# Patient Record
Sex: Female | Born: 1935
Health system: Southern US, Community
[De-identification: ages and names within clinical notes are randomized; demographics above are authoritative.]

## PROBLEM LIST (undated history)

## (undated) DIAGNOSIS — T7840XA Allergy, unspecified, initial encounter: Secondary | ICD-10-CM

## (undated) DIAGNOSIS — Z8619 Personal history of other infectious and parasitic diseases: Secondary | ICD-10-CM

## (undated) DIAGNOSIS — C801 Malignant (primary) neoplasm, unspecified: Secondary | ICD-10-CM

## (undated) DIAGNOSIS — I10 Essential (primary) hypertension: Secondary | ICD-10-CM

## (undated) DIAGNOSIS — E7800 Pure hypercholesterolemia, unspecified: Secondary | ICD-10-CM

## (undated) DIAGNOSIS — B019 Varicella without complication: Secondary | ICD-10-CM

## (undated) DIAGNOSIS — K219 Gastro-esophageal reflux disease without esophagitis: Secondary | ICD-10-CM

## (undated) DIAGNOSIS — Z96659 Presence of unspecified artificial knee joint: Secondary | ICD-10-CM

## (undated) DIAGNOSIS — N390 Urinary tract infection, site not specified: Secondary | ICD-10-CM

## (undated) DIAGNOSIS — J45909 Unspecified asthma, uncomplicated: Secondary | ICD-10-CM

## (undated) DIAGNOSIS — M1990 Unspecified osteoarthritis, unspecified site: Secondary | ICD-10-CM

## (undated) DIAGNOSIS — K635 Polyp of colon: Secondary | ICD-10-CM

## (undated) DIAGNOSIS — K8020 Calculus of gallbladder without cholecystitis without obstruction: Secondary | ICD-10-CM

## (undated) DIAGNOSIS — H3530 Unspecified macular degeneration: Secondary | ICD-10-CM

## (undated) DIAGNOSIS — K5792 Diverticulitis of intestine, part unspecified, without perforation or abscess without bleeding: Secondary | ICD-10-CM

## (undated) HISTORY — DX: Urinary tract infection, site not specified: N39.0

## (undated) HISTORY — DX: Personal history of other infectious and parasitic diseases: Z86.19

## (undated) HISTORY — PX: REPLACEMENT TOTAL KNEE: SUR1224

## (undated) HISTORY — DX: Unspecified macular degeneration: H35.30

## (undated) HISTORY — DX: Unspecified osteoarthritis, unspecified site: M19.90

## (undated) HISTORY — DX: Diverticulitis of intestine, part unspecified, without perforation or abscess without bleeding: K57.92

## (undated) HISTORY — DX: Calculus of gallbladder without cholecystitis without obstruction: K80.20

## (undated) HISTORY — DX: Malignant (primary) neoplasm, unspecified: C80.1

## (undated) HISTORY — DX: Allergy, unspecified, initial encounter: T78.40XA

## (undated) HISTORY — DX: Essential (primary) hypertension: I10

## (undated) HISTORY — PX: UPPER GASTROINTESTINAL ENDOSCOPY: SHX188

## (undated) HISTORY — DX: Unspecified asthma, uncomplicated: J45.909

## (undated) HISTORY — DX: Gastro-esophageal reflux disease without esophagitis: K21.9

## (undated) HISTORY — DX: Presence of unspecified artificial knee joint: Z96.659

## (undated) HISTORY — DX: Varicella without complication: B01.9

## (undated) HISTORY — DX: Polyp of colon: K63.5

## (undated) HISTORY — PX: CATARACT EXTRACTION, BILATERAL: SHX1313

## (undated) HISTORY — DX: Pure hypercholesterolemia, unspecified: E78.00

## (undated) HISTORY — PX: BREAST BIOPSY: SHX20

---

## 1971-09-16 HISTORY — PX: ECTOPIC PREGNANCY SURGERY: SHX613

## 1991-09-16 HISTORY — PX: CHOLECYSTECTOMY: SHX55

## 1998-09-15 DIAGNOSIS — Z8619 Personal history of other infectious and parasitic diseases: Secondary | ICD-10-CM

## 1998-09-15 HISTORY — PX: ESOPHAGOGASTRODUODENOSCOPY: SHX1529

## 1998-09-15 HISTORY — DX: Personal history of other infectious and parasitic diseases: Z86.19

## 1999-09-16 HISTORY — PX: APPENDECTOMY: SHX54

## 2012-04-14 DIAGNOSIS — H5213 Myopia, bilateral: Secondary | ICD-10-CM | POA: Insufficient documentation

## 2014-08-29 HISTORY — PX: COLONOSCOPY: SHX174

## 2015-06-18 DIAGNOSIS — M1712 Unilateral primary osteoarthritis, left knee: Secondary | ICD-10-CM | POA: Insufficient documentation

## 2015-06-25 DIAGNOSIS — I10 Essential (primary) hypertension: Secondary | ICD-10-CM | POA: Insufficient documentation

## 2015-06-25 DIAGNOSIS — J4520 Mild intermittent asthma, uncomplicated: Secondary | ICD-10-CM | POA: Insufficient documentation

## 2015-06-25 DIAGNOSIS — J45909 Unspecified asthma, uncomplicated: Secondary | ICD-10-CM | POA: Insufficient documentation

## 2015-06-26 DIAGNOSIS — Z96659 Presence of unspecified artificial knee joint: Secondary | ICD-10-CM

## 2015-06-26 HISTORY — DX: Presence of unspecified artificial knee joint: Z96.659

## 2018-07-15 LAB — BASIC METABOLIC PANEL
BUN: 22 — AB (ref 4–21)
Creatinine: 0.7 (ref 0.5–1.1)
Glucose: 82
Potassium: 4.2 (ref 3.4–5.3)
Sodium: 143 (ref 137–147)

## 2018-07-15 LAB — LIPID PANEL
Cholesterol: 187 (ref 0–200)
HDL: 49 (ref 35–70)
LDL Cholesterol: 125
LDl/HDL Ratio: 3.8
Triglycerides: 64 (ref 40–160)

## 2018-07-15 LAB — CBC AND DIFFERENTIAL
HCT: 37 (ref 36–46)
Hemoglobin: 12.3 (ref 12.0–16.0)
Platelets: 252 (ref 150–399)
WBC: 7.7

## 2018-07-15 LAB — HEPATIC FUNCTION PANEL
ALK PHOS: 62 (ref 25–125)
ALT: 28 (ref 7–35)
AST: 26 (ref 13–35)
Bilirubin, Total: 0.5

## 2018-07-15 LAB — HEMOGLOBIN A1C: Hemoglobin A1C: 5.5

## 2018-10-20 ENCOUNTER — Telehealth: Payer: Self-pay | Admitting: Family Medicine

## 2018-10-20 ENCOUNTER — Ambulatory Visit (INDEPENDENT_AMBULATORY_CARE_PROVIDER_SITE_OTHER): Payer: Medicare Other

## 2018-10-20 ENCOUNTER — Encounter: Payer: Self-pay | Admitting: Gastroenterology

## 2018-10-20 ENCOUNTER — Ambulatory Visit: Payer: Medicare Other | Admitting: Family Medicine

## 2018-10-20 ENCOUNTER — Encounter: Payer: Self-pay | Admitting: Family Medicine

## 2018-10-20 VITALS — BP 126/68 | HR 78 | Temp 97.8°F | Ht 64.5 in | Wt 180.6 lb

## 2018-10-20 DIAGNOSIS — K219 Gastro-esophageal reflux disease without esophagitis: Secondary | ICD-10-CM

## 2018-10-20 DIAGNOSIS — R059 Cough, unspecified: Secondary | ICD-10-CM

## 2018-10-20 DIAGNOSIS — Z1211 Encounter for screening for malignant neoplasm of colon: Secondary | ICD-10-CM | POA: Diagnosis not present

## 2018-10-20 DIAGNOSIS — M2550 Pain in unspecified joint: Secondary | ICD-10-CM

## 2018-10-20 DIAGNOSIS — R05 Cough: Secondary | ICD-10-CM

## 2018-10-20 DIAGNOSIS — K5900 Constipation, unspecified: Secondary | ICD-10-CM

## 2018-10-20 DIAGNOSIS — E7800 Pure hypercholesterolemia, unspecified: Secondary | ICD-10-CM

## 2018-10-20 DIAGNOSIS — Z780 Asymptomatic menopausal state: Secondary | ICD-10-CM

## 2018-10-20 DIAGNOSIS — R739 Hyperglycemia, unspecified: Secondary | ICD-10-CM

## 2018-10-20 MED ORDER — FLUTICASONE-SALMETEROL 100-50 MCG/DOSE IN AEPB: 1.0000 | INHALATION_SPRAY | Freq: Every day | RESPIRATORY_TRACT | 1 refills | Status: DC

## 2018-10-20 MED ORDER — GABAPENTIN 100 MG PO CAPS: 100.0000 mg | ORAL_CAPSULE | Freq: Every day | ORAL | 3 refills | Status: DC

## 2018-10-20 MED ORDER — MELOXICAM 15 MG PO TABS: 15.0000 mg | ORAL_TABLET | Freq: Every day | ORAL | 0 refills | Status: DC

## 2018-10-20 NOTE — Telephone Encounter (Signed)
Stacy from [facility] called with results of chest xray report.    IMPRESSION: Curvilinear density seen only on the lateral projection. Noncontrast CT is recommended to rule out underlying mass lesion although this likely represents an epicardial fat pad.  Flow at [facility] at [facility] notified that the results are in chart and radiologist wanted them to know the results.  Routing to office.

## 2018-10-20 NOTE — Progress Notes (Signed)
Renee Green is a 85 y.o. female is here to Renee Green.   Patient Care Team: Renee Deutscher, DO as PCP - General (Family Medicine)   History of Present Illness:   HPI: Cough x week, worsening, with wheeze. No CP. Hx of severe PNA. Worried that this could happen again. Nonsmoker.   Arthritis. Multijoint. Takes NSAIDs sparingly.   Medication list reviewed.   Health Maintenance Due  Topic Date Due  . TETANUS/TDAP  08/22/1952  . DEXA SCAN  08/22/2000  . PNA vac Low Risk Adult (1 of 2 - PCV13) 08/22/2000   Depression screen PHQ 2/9 10/20/2018  Decreased Interest 0  Down, Depressed, Hopeless 0  PHQ - 2 Score 0  Altered sleeping 0  Tired, decreased energy 1  Change in appetite 1  Feeling bad or failure about yourself  0  Trouble concentrating 0  Moving slowly or fidgety/restless 0  Suicidal thoughts 0  PHQ-9 Score 2    PMHx, SurgHx, SocialHx, Medications, and Allergies were reviewed in the Visit Navigator and updated as appropriate.   Past Medical History:  Diagnosis Date  . Asthma   . Chicken pox   . Diverticulitis   . High cholesterol   . Hypertension      Past Surgical History:  Procedure Laterality Date  . APPENDECTOMY  2001  . BREAST BIOPSY  1987, 1993  . CHOLECYSTECTOMY  1993  . [place]   lost right tube   . REPLACEMENT TOTAL KNEE Bilateral 2016 and 2017     Family History  Problem Relation Age of Onset  . Hypertension Mother   . Breast cancer Mother   . Lung cancer Father   . Prostate cancer Brother   . Hypertension Brother   . Early death Maternal Grandfather   . Early death Brother     Social History   Tobacco Use  . Smoking status: Never Smoker  . Smokeless tobacco: Never Used  Substance Use Topics  . Alcohol use: Never    Frequency: Never  . Drug use: Never    Current Medications and Allergies   .  aspirin EC 81 MG tablet, Take 81 mg by mouth daily., Disp: , Rfl:  .  calcium citrate-vitamin D  (CITRACAL+D) 315-200 MG-UNIT tablet, Take 1 tablet by mouth 2 (two) times daily., Disp: , Rfl:  .  Cholecalciferol (VITAMIN D3) 50 MCG (2000 UT) CHEW, Chew by mouth., Disp: , Rfl:  .  ibuprofen (ADVIL,MOTRIN) 200 MG tablet, Take 200 mg by mouth every 6 (six) hours as needed., Disp: , Rfl:  .  Omega-3 Fatty Acids (FISH OIL) 1200 MG CPDR, Take 1,200 mg by mouth 4 (four) times daily., Disp: , Rfl:  .  omeprazole (PRILOSEC) 20 MG capsule, Take 20 mg by mouth daily., Disp: , Rfl:  .  Polyethyl Glycol-Propyl Glycol 0.4-0.3 % SOLN, Apply to eye., Disp: , Rfl:  .  Red Yeast Rice 600 MG CAPS, Take by mouth., Disp: , Rfl:  .  Fluticasone-Salmeterol (ADVAIR DISKUS) 100-50 MCG/DOSE AEPB, Inhale 1 puff into the lungs daily., Disp: 60 each, Rfl: 1 .  gabapentin (NEURONTIN) 100 MG capsule, Take 1 capsule (100 mg total) by mouth at bedtime. Increase as tolerated up to three times a day, Disp: 90 capsule, Rfl: 3 .  hydrochlorothiazide (HYDRODIURIL) 50 MG tablet, Take 1 tablet (50 mg total) by mouth daily., Disp: 90 tablet, Rfl: 3 .  losartan (COZAAR) 50 MG tablet, Take 1 tablet (50 mg total) by mouth  daily., Disp: 90 tablet, Rfl: 3 .  meloxicam (MOBIC) 15 MG tablet, Take 1 tablet (15 mg total) by mouth daily., Disp: 30 tablet, Rfl: 0  Allergies  Allergen Reactions  . Latex     Blister   . Penicillins Rash   Review of Systems   Pertinent items are noted in the HPI. Otherwise, a complete ROS is negative.  Vitals   Vitals:   10/20/18 1014  BP: 126/68  Pulse: 78  Temp: 97.8 F (36.6 C)  TempSrc: Oral  SpO2: 97%  Weight: 180 lb 9.6 oz (81.9 kg)  Height: 5' 4.5" (1.638 m)     Body mass index is 30.52 kg/m.  Physical Exam   Physical Exam Vitals signs and nursing note reviewed.  Constitutional:      General: She is not in acute distress.    Appearance: Normal appearance.  HENT:     Head: Normocephalic and atraumatic.     Right Ear: Tympanic membrane, ear canal and external ear normal.      Left Ear: Tympanic membrane, ear canal and external ear normal.     Nose: Nose normal.     Mouth/Throat:     Mouth: Mucous membranes are moist.  Eyes:     Extraocular Movements: Extraocular movements intact.     Conjunctiva/sclera: Conjunctivae normal.     Pupils: Pupils are equal, round, and reactive to light.  Neck:     Musculoskeletal: Normal range of motion and neck supple.  Cardiovascular:     Rate and Rhythm: Normal rate and regular rhythm.  Pulmonary:     Effort: Pulmonary effort is normal.     Breath sounds: No wheezing or rhonchi.  Abdominal:     Palpations: Abdomen is soft.  Skin:    General: Skin is warm.     Capillary Refill: Capillary refill takes less than 2 seconds.  Neurological:     General: No focal deficit present.     Mental Status: She is alert and oriented to person, place, and time.  Psychiatric:        Mood and Affect: Mood normal.        Behavior: Behavior normal.        Thought Content: Thought content normal.        Judgment: Judgment normal.     Results for orders placed or performed in visit on 10/20/18  CBC and differential  Result Value Ref Range   Hemoglobin 12.3 12.0 - 16.0   HCT 37 36 - 46   Platelets 252 150 - 399   WBC 7.7   Basic metabolic panel  Result Value Ref Range   Glucose 82    BUN 22 (A) 4 - 21   Creatinine 0.7 0.5 - 1.1   Potassium 4.2 3.4 - 5.3   Sodium 143 137 - 147  Lipid panel  Result Value Ref Range   LDl/HDL Ratio 3.8    Triglycerides 64 40 - 160   Cholesterol 187 0 - 200   HDL 49 35 - 70   LDL Cholesterol 125   Hepatic function panel  Result Value Ref Range   Alkaline Phosphatase 62 25 - 125   ALT 28 7 - 35   AST 26 13 - 35   Bilirubin, Total 0.5   Hemoglobin A1c  Result Value Ref Range   Hemoglobin A1C 5.5     Assessment and Plan   Renee Green was seen today for establish care.  Diagnoses and all orders for this  visit:  Cough -     DG Chest 2 View; Future -     Fluticasone-Salmeterol (ADVAIR DISKUS)  100-50 MCG/DOSE AEPB; Inhale 1 puff into the lungs daily.  Screening for colon cancer -     Ambulatory referral to Gastroenterology  Gastroesophageal reflux disease, esophagitis presence not specified  Postmenopausal  Pure hypercholesterolemia  Arthralgia, unspecified joint -     meloxicam (MOBIC) 15 MG tablet; Take 1 tablet (15 mg total) by mouth daily.  Constipation, unspecified constipation type  Hyperglycemia  Other orders -     gabapentin (NEURONTIN) 100 MG capsule; Take 1 capsule (100 mg total) by mouth at bedtime. Increase as tolerated up to three times a day   . Orders and follow up as documented in [place], reviewed diet, exercise and weight control, cardiovascular risk and specific lipid/LDL goals reviewed, reviewed medications and side effects in detail.  . Reviewed expectations re: course of current medical issues. . Outlined signs and symptoms indicating need for more acute intervention. . Patient verbalized understanding and all questions were answered. . Patient received an After Visit Summary.  Renee Deutscher, DO [place] [place] 10/29/2018  Records requested if needed. Time spent with the patient: 25 minutes, of which >50% was spent in obtaining information about her symptoms, reviewing her previous labs, evaluations, and treatments, counseling her about her condition (please see the discussed topics above), and developing a plan to further investigate it; she had a number of questions which I addressed.

## 2018-10-20 NOTE — Telephone Encounter (Signed)
See note

## 2018-10-20 NOTE — Telephone Encounter (Signed)
MEDICATION: hydrochlorothiazide (HYDRODIURIL) 50 MG tablet  losartan (COZAAR) 50 MG tablet (Pharmacy says they do not have 50 MG tablets, they may call to clarify according to patient.)   PHARMACY:  [id number] - [place], [place] - [address] A 90 DAY SUPPLY : Yes  IS PATIENT OUT OF MEDICATION: No  IF NOT; HOW MUCH IS LEFT: Maybe a week worth left  LAST APPOINTMENT DATE: @2 /01/2019  NEXT APPOINTMENT DATE:@Visit  date not found  OTHER COMMENTS:    **Let patient know to contact pharmacy at the end of the day to make sure medication is ready. **  ** Please notify patient to allow 48-72 hours to process**  **Encourage patient to contact the pharmacy for refills or they can request refills through [facility]**

## 2018-10-21 ENCOUNTER — Ambulatory Visit (HOSPITAL_COMMUNITY)
Admission: RE | Admit: 2018-10-21 | Discharge: 2018-10-21 | Disposition: A | Discharge status: Home / Self Care | Payer: Medicare Other | Source: Ambulatory Visit | Attending: Family Medicine | Admitting: Family Medicine

## 2018-10-21 ENCOUNTER — Other Ambulatory Visit: Payer: Self-pay

## 2018-10-21 DIAGNOSIS — R0602 Shortness of breath: Secondary | ICD-10-CM | POA: Diagnosis not present

## 2018-10-21 MED ORDER — HYDROCHLOROTHIAZIDE 50 MG PO TABS: 50.0000 mg | ORAL_TABLET | Freq: Every day | ORAL | 3 refills | Status: DC

## 2018-10-21 MED ORDER — LOSARTAN POTASSIUM 50 MG PO TABS: 50.0000 mg | ORAL_TABLET | Freq: Every day | ORAL | 3 refills | Status: DC

## 2018-10-21 NOTE — Telephone Encounter (Signed)
Called patient let her know that they will work her in at [facility]. She will go now.

## 2018-10-21 NOTE — Telephone Encounter (Signed)
Please call patient and let her know that her chest xray doesn't show any pneumonia.  It does however show a small area that needs further imaging. It is recommended that we get a CT scan to further investigate. If she is agreeable, please order urgent "noncontrast CT" of chest for patient and notify referral coordinator, Honor Loh of urgent order.  Because she is a new patient as well -- please clarify if she has any history of prior cardiac or lung issues that may not have been discussed during office visit. Also, review smoking status/history.  Further intervention based on CT scan results.

## 2018-10-21 NOTE — Telephone Encounter (Signed)
Please advise 

## 2018-10-21 NOTE — Telephone Encounter (Signed)
Called patient let her know I will call her with app she has no questions she has history of smoking in her 63's and 30's for a few years. No cardiac history in herself or family. Had frequent lung infections about 20ys ago for about a year.

## 2018-10-21 NOTE — Telephone Encounter (Signed)
Called patient she needs refills of medications called in have taken care of that for her. She will call if any further questions.

## 2018-10-22 ENCOUNTER — Other Ambulatory Visit: Payer: Self-pay

## 2018-10-22 ENCOUNTER — Encounter: Payer: Self-pay | Admitting: Family Medicine

## 2018-10-22 MED ORDER — LOSARTAN POTASSIUM 25 MG PO TABS: 50.0000 mg | ORAL_TABLET | Freq: Every day | ORAL | 0 refills | Status: DC

## 2018-11-08 ENCOUNTER — Other Ambulatory Visit: Payer: Self-pay | Admitting: Family Medicine

## 2018-11-08 DIAGNOSIS — M2550 Pain in unspecified joint: Secondary | ICD-10-CM

## 2018-11-09 ENCOUNTER — Other Ambulatory Visit: Payer: Medicare Other

## 2018-11-09 ENCOUNTER — Ambulatory Visit: Payer: Medicare Other | Admitting: Gastroenterology

## 2018-11-09 ENCOUNTER — Encounter: Payer: Self-pay | Admitting: Gastroenterology

## 2018-11-09 VITALS — BP 110/58 | HR 75 | Ht 65.0 in | Wt 182.0 lb

## 2018-11-09 DIAGNOSIS — K529 Noninfective gastroenteritis and colitis, unspecified: Secondary | ICD-10-CM

## 2018-11-09 DIAGNOSIS — R159 Full incontinence of feces: Secondary | ICD-10-CM | POA: Diagnosis not present

## 2018-11-09 DIAGNOSIS — R1032 Left lower quadrant pain: Secondary | ICD-10-CM | POA: Diagnosis not present

## 2018-11-09 MED ORDER — PEG-KCL-NACL-NASULF-NA ASC-C 140 G PO SOLR: 140.0000 g | ORAL | 0 refills | Status: DC

## 2018-11-09 NOTE — Patient Instructions (Signed)
If you are age 83 or older, your body mass index should be between 23-30. Your Body mass index is 30.29 kg/m. If this is out of the aforementioned range listed, please consider follow up with your Primary Care Provider.  If you are age 46 or younger, your body mass index should be between 19-25. Your Body mass index is 30.29 kg/m. If this is out of the aformentioned range listed, please consider follow up with your Primary Care Provider.   You have been scheduled for a colonoscopy. Please follow written instructions given to you at your visit today.  Please pick up your prep supplies at the pharmacy within the next 1-3 days. If you use inhalers (even only as needed), please bring them with you on the day of your procedure. Your physician has requested that you go to www.startemmi.com and enter the access code given to you at your visit today. This web site gives a general overview about your procedure. However, you should still follow specific instructions given to you by our office regarding your preparation for the procedure.  Your provider has requested that you go to the basement level for lab work before leaving today. Press "B" on the elevator. The lab is located at the first door on the left as you exit the elevator.  It was a pleasure to see you today!  Dr. Loletha Carrow

## 2018-11-09 NOTE — Progress Notes (Signed)
[facility] Consult Note:  History: Renee Green 11/09/2018  Referring physician: Briscoe Deutscher, DO  Reason for consult/chief complaint: Diarrhea (Per pt extreme diarrhea once a week ); Abdominal Pain (General abd pain. cramping every day. comes and goes, today its been all day. ); Rectal Bleeding (Blood on surface when she wipes. ); and Rectal Pain (rectal pain with the cramping episodes )   Subjective  HPI:  This is a pleasant 85 year old woman referred by primary care after recent visit there.  She established care with Dr. Juleen China in October after moving here from [facility].  Everli reports that she developed frequent diarrhea late last summer.  She has been under increased stress with her husband's poor health and their move to this area. It is difficult to get a clear inconsistent history but it sounds like she has some lower abdominal cramping with a loose stool every couple of days, then she will have episodes of severe urgency with large volume loose stool that may cause incontinence.  She has some crampy lower abdominal pain, more so before bowel movements.  After wiping she may see blood on the paper. She had a recent primary care appointment for chronic cough, chest x-ray was done and an inhaler prescribed.  She was then referred to [place]  Of note, she was treated for urinary tract infection twice this past July by her previous primary care provider, and seems to recall getting ciprofloxacin and perhaps one other antibiotic.  ROS:  Review of Systems  Constitutional: Negative for appetite change and unexpected weight change.  HENT: Negative for mouth sores and voice change.   Eyes: Negative for pain and redness.  Respiratory: Negative for cough and shortness of breath.   Cardiovascular: Negative for chest pain and palpitations.  Genitourinary: Negative for dysuria and hematuria.  Musculoskeletal: Positive for arthralgias. Negative for  myalgias.  Skin: Negative for pallor and rash.  Allergic/Immunologic: Positive for environmental allergies.  Neurological: Negative for weakness and headaches.  Hematological: Negative for adenopathy.     Past Medical History: Past Medical History:  Diagnosis Date  . Arthritis   . Asthma   . Chicken pox   . Colon polyp   . Diverticulitis   . Gallstones   . GERD (gastroesophageal reflux disease)   . High cholesterol   . Hypertension   . UTI (urinary tract infection)      Past Surgical History: Past Surgical History:  Procedure Laterality Date  . APPENDECTOMY  2001  . BREAST BIOPSY  1987, 1993  . CHOLECYSTECTOMY  1993  . COLONOSCOPY  08/29/2014  . [place]   lost right tube   . ESOPHAGOGASTRODUODENOSCOPY  2000  . REPLACEMENT TOTAL KNEE Bilateral 2016 and 2017   She requires multiple prior endoscopic procedures in both [place] and [facility]. She recalls the last one was sometime in 2015, and that she was told to come back in 5 years because of polyps.  Family History: Family History  Problem Relation Age of Onset  . Hypertension Mother   . Breast cancer Mother   . Colon cancer Mother   . Lung cancer Father   . Prostate cancer Brother   . Hypertension Brother   . Early death Maternal Grandfather   . Early death Brother     Social History: Social History   Socioeconomic History  . Marital status: Married    Spouse name: Not on file  . Number of children: 2  .  Years of education: Not on file  . Highest education level: Not on file  Occupational History  . Occupation: retired  Scientific laboratory technician  . Financial resource strain: Not on file  . Food insecurity:    Worry: Not on file    Inability: Not on file  . Transportation needs:    Medical: Not on file    Non-medical: Not on file  Tobacco Use  . Smoking status: Never Smoker  . Smokeless tobacco: Never Used  Substance and Sexual Activity  . Alcohol use: Never     Frequency: Never  . Drug use: Never  . Sexual activity: Not on file  Lifestyle  . Physical activity:    Days per week: 5 days    Minutes per session: 30 min  . Stress: Not on file  Relationships  . Social connections:    Talks on phone: Not on file    Gets together: Not on file    Attends religious service: Not on file    Active member of club or organization: Not on file    Attends meetings of clubs or organizations: Not on file    Relationship status: Not on file  Other Topics Concern  . Not on file  Social History Narrative  . Not on file    Allergies: Allergies  Allergen Reactions  . Latex     Blister   . Penicillins Rash    Outpatient Meds: Current Outpatient Medications  Medication Sig Dispense Refill  . AMBULATORY NON FORMULARY MEDICATION Medication Name: Osteo Bi-Flex Glucosamine Chondroitin 2 per day    . aspirin EC 81 MG tablet Take 81 mg by mouth daily.    . calcium citrate-vitamin D (CITRACAL+D) 315-200 MG-UNIT tablet Take 1 tablet by mouth 2 (two) times daily.    . Cholecalciferol (VITAMIN D3) 50 MCG (2000 UT) CHEW Chew by mouth.    . Fluticasone-Salmeterol (WIXELA INHUB) 100-50 MCG/DOSE AEPB Inhale 1 puff into the lungs daily.    Marland Kitchen gabapentin (NEURONTIN) 100 MG capsule Take 1 capsule (100 mg total) by mouth at bedtime. Increase as tolerated up to three times a day 90 capsule 3  . hydrochlorothiazide (HYDRODIURIL) 50 MG tablet Take 1 tablet (50 mg total) by mouth daily. 90 tablet 3  . losartan (COZAAR) 25 MG tablet Take 2 tablets (50 mg total) by mouth daily. 60 tablet 0  . meloxicam (MOBIC) 15 MG tablet TAKE 1 TABLET BY MOUTH EVERY DAY 30 tablet 0  . Multiple Vitamins-Minerals (PRESERVISION AREDS 2 PO) Take by mouth 2 (two) times daily.    . Omega-3 Fatty Acids (FISH OIL) 1200 MG CPDR Take 1,200 mg by mouth 4 (four) times daily.    Marland Kitchen omeprazole (PRILOSEC) 20 MG capsule Take 20 mg by mouth daily.    . Red Yeast Rice 600 MG CAPS Take by mouth.    Marland Kitchen  PEG-KCl-NaCl-NaSulf-Na Asc-C (PLENVU) 140 g SOLR Take 140 g by mouth as directed. 1 each 0   No current facility-administered medications for this visit.       ___________________________________________________________________ Objective   Exam:  BP (!) 110/58 (BP Location: Left Arm, Patient Position: Sitting, Cuff Size: Normal)   Pulse 75   Ht 5\' 5"  (1.651 m)   Wt 182 lb (82.6 kg)   BMI 30.29 kg/m    General: Well-appearing, good muscle mass, well hydrated, normal vocal quality  Eyes: sclera anicteric, no redness  ENT: oral mucosa moist without lesions, no cervical or supraclavicular lymphadenopathy  CV: RRR without  murmur, S1/S2, no JVD, no peripheral edema  Resp: clear to auscultation bilaterally, normal RR and effort noted  GI: soft, no tenderness, with active bowel sounds. No guarding or palpable organomegaly noted.  Skin; warm and dry, no rash or jaundice noted  Neuro: awake, alert and oriented x 3. Normal gross motor function and fluent speech  Labs:  CBC Latest Ref Rng & Units 07/15/2018  WBC - 7.7  Hemoglobin 12.0 - 16.0 12.3  Hematocrit 36 - 46 37  Platelets 150 - 399 252   CMP Latest Ref Rng & Units 07/15/2018  BUN 4 - 21 22(A)  Creatinine 0.5 - 1.1 0.7  Sodium 137 - 147 143  Potassium 3.4 - 5.3 4.2  Alkaline Phos 25 - 125 62  AST 13 - 35 26  ALT 7 - 35 28   Hemoglobin A1c 5.19 June 2018  Assessment: Encounter Diagnoses  Name Primary?  . Chronic diarrhea Yes  . Full incontinence of feces   . LLQ abdominal pain     Tamani has had diarrhea for about 6 months some occasional crampy lower abdominal pain.  The diarrhea has been so bad at times it causes incontinence.  There is been some blood on the paper which sounds likely benign anal rectal bleeding just hemorrhoids.  Colitis seems less likely. She received antibiotics last year, raising suspicion for C. Difficile. She believes she has been on PPI for years since H. pylori was discovered, but  she stopped it a few months ago and has gone to taking it just as needed.  She believes she has been on the ARB for at least several years.  Plan: C. difficile PCR Colonoscopy a few weeks from now, to allow time for stool study to be done.  She is agreeable after discussion of procedure and risks.  The benefits and risks of the planned procedure were described in detail with the patient or (when appropriate) their health care proxy.  Risks were outlined as including, but not limited to, bleeding, infection, perforation, adverse medication reaction leading to cardiac or pulmonary decompensation.  The limitation of incomplete mucosal visualization was also discussed.  No guarantees or warranties were given.  As needed Imodium.  Thank you for the courtesy of this consult.  Please call me with any questions or concerns.  Nelida Meuse III  CC: Referring provider noted above

## 2018-11-10 ENCOUNTER — Other Ambulatory Visit: Payer: Medicare Other

## 2018-11-10 DIAGNOSIS — R1032 Left lower quadrant pain: Secondary | ICD-10-CM

## 2018-11-10 DIAGNOSIS — R159 Full incontinence of feces: Secondary | ICD-10-CM

## 2018-11-10 DIAGNOSIS — K529 Noninfective gastroenteritis and colitis, unspecified: Secondary | ICD-10-CM

## 2018-11-11 ENCOUNTER — Other Ambulatory Visit: Payer: Self-pay | Admitting: Family Medicine

## 2018-11-11 LAB — CLOSTRIDIUM DIFFICILE TOXIN B, QUALITATIVE, REAL-TIME PCR: Toxigenic C. Difficile by PCR: NOT DETECTED

## 2018-11-15 ENCOUNTER — Other Ambulatory Visit: Payer: Self-pay | Admitting: Family Medicine

## 2018-11-18 ENCOUNTER — Encounter (HOSPITAL_COMMUNITY): Payer: Self-pay

## 2018-11-18 ENCOUNTER — Emergency Department (HOSPITAL_COMMUNITY)
Admission: EM | Admit: 2018-11-18 | Discharge: 2018-11-18 | Disposition: A | Discharge status: Home / Self Care | Payer: Medicare Other | Attending: Emergency Medicine | Admitting: Emergency Medicine

## 2018-11-18 ENCOUNTER — Other Ambulatory Visit: Payer: Self-pay

## 2018-11-18 DIAGNOSIS — Z96651 Presence of right artificial knee joint: Secondary | ICD-10-CM | POA: Diagnosis not present

## 2018-11-18 DIAGNOSIS — Z96652 Presence of left artificial knee joint: Secondary | ICD-10-CM | POA: Insufficient documentation

## 2018-11-18 DIAGNOSIS — Z91040 Latex allergy status: Secondary | ICD-10-CM | POA: Diagnosis not present

## 2018-11-18 DIAGNOSIS — R55 Syncope and collapse: Secondary | ICD-10-CM | POA: Diagnosis not present

## 2018-11-18 DIAGNOSIS — Z7982 Long term (current) use of aspirin: Secondary | ICD-10-CM | POA: Insufficient documentation

## 2018-11-18 DIAGNOSIS — I10 Essential (primary) hypertension: Secondary | ICD-10-CM | POA: Diagnosis not present

## 2018-11-18 DIAGNOSIS — Z79899 Other long term (current) drug therapy: Secondary | ICD-10-CM | POA: Insufficient documentation

## 2018-11-18 DIAGNOSIS — R110 Nausea: Secondary | ICD-10-CM | POA: Insufficient documentation

## 2018-11-18 DIAGNOSIS — J45909 Unspecified asthma, uncomplicated: Secondary | ICD-10-CM | POA: Insufficient documentation

## 2018-11-18 DIAGNOSIS — R1010 Upper abdominal pain, unspecified: Secondary | ICD-10-CM | POA: Insufficient documentation

## 2018-11-18 LAB — CBC WITH DIFFERENTIAL/PLATELET
Abs Immature Granulocytes: 0.02 10*3/uL (ref 0.00–0.07)
Basophils Absolute: 0.1 10*3/uL (ref 0.0–0.1)
Basophils Relative: 1 %
Eosinophils Absolute: 0.6 10*3/uL — ABNORMAL HIGH (ref 0.0–0.5)
Eosinophils Relative: 7 %
HCT: 30.2 % — ABNORMAL LOW (ref 36.0–46.0)
Hemoglobin: 9.6 g/dL — ABNORMAL LOW (ref 12.0–15.0)
Immature Granulocytes: 0 %
Lymphocytes Relative: 17 %
Lymphs Abs: 1.3 10*3/uL (ref 0.7–4.0)
MCH: 30.5 pg (ref 26.0–34.0)
MCHC: 31.8 g/dL (ref 30.0–36.0)
MCV: 95.9 fL (ref 80.0–100.0)
Monocytes Absolute: 0.5 10*3/uL (ref 0.1–1.0)
Monocytes Relative: 7 %
Neutro Abs: 5.3 10*3/uL (ref 1.7–7.7)
Neutrophils Relative %: 68 %
Platelets: 194 10*3/uL (ref 150–400)
RBC: 3.15 MIL/uL — ABNORMAL LOW (ref 3.87–5.11)
RDW: 12.6 % (ref 11.5–15.5)
WBC: 7.8 10*3/uL (ref 4.0–10.5)
nRBC: 0 % (ref 0.0–0.2)

## 2018-11-18 LAB — BASIC METABOLIC PANEL
Anion gap: 8 (ref 5–15)
BUN: 26 mg/dL — ABNORMAL HIGH (ref 8–23)
CO2: 23 mmol/L (ref 22–32)
Calcium: 8.1 mg/dL — ABNORMAL LOW (ref 8.9–10.3)
Chloride: 109 mmol/L (ref 98–111)
Creatinine, Ser: 0.73 mg/dL (ref 0.44–1.00)
GFR calc Af Amer: >60 mL/min (ref >60)
GFR calc non Af Amer: >60 mL/min (ref >60)
Glucose, Bld: 97 mg/dL (ref 70–99)
Potassium: 3 mmol/L — ABNORMAL LOW (ref 3.5–5.1)
SODIUM: 140 mmol/L (ref 135–145)

## 2018-11-18 LAB — I-STAT TROPONIN, ED: TROPONIN I, POC: 0.01 ng/mL (ref 0.00–0.08)

## 2018-11-18 LAB — CBG MONITORING, ED: Glucose-Capillary: 100 mg/dL — ABNORMAL HIGH (ref 70–99)

## 2018-11-18 LAB — POC OCCULT BLOOD, ED: Fecal Occult Bld: NEGATIVE

## 2018-11-18 MED ORDER — POTASSIUM CHLORIDE CRYS ER 20 MEQ PO TBCR: 40.0000 meq | EXTENDED_RELEASE_TABLET | Freq: Once | ORAL | Status: DC

## 2018-11-18 MED ORDER — SODIUM CHLORIDE 0.9 % IV BOLUS
1000.0000 mL | Freq: Once | INTRAVENOUS | Status: AC
  Administered 2018-11-18: 1000 mL via INTRAVENOUS

## 2018-11-18 NOTE — Discharge Instructions (Addendum)
You were here in the emergency department for evaluation after a syncopal event.  You had an EKG and lab work that did not show an serious cause of your episode.  You were more anemia that prior and this will need to be followed by your doctors. You will need to follow-up with your primary care doctor.  Please still well-hydrated and eat regular meals.  Return if any concerns.

## 2018-11-18 NOTE — ED Triage Notes (Signed)
Pt arrives to [facility] ED via [facility] after patient fainted at salon. States she was in salon chair getting hair done, suddenly felt upper abd pain and naus and face became extremely pale. Went to restroom and fainted, woke up on floor with pants down and salon staff at her side. States she did go to [employer] program this morning, also did not eat today as she does intermittent fasting at times. States she has fainted in the past when she has been dehydrated.  SBP 120/60 (sitting) to 113/67 (standing) on EMS arrival CBG 104 12 lead unremarkable SR 60s 18 rr 99% RA  Pt well appearing in ER at this time, denies any s/s of CP, SOB, dizziness, chills.

## 2018-11-18 NOTE — ED Provider Notes (Signed)
[facility] EMERGENCY DEPARTMENT Provider Note   CSN: [id number] Arrival date & time: 11/18/18  1414    History   Chief Complaint Chief Complaint  Patient presents with  . Loss of Consciousness    HPI Renee Green is a 85 y.o. female.  She is brought in by EMS after a syncopal event at the hair salon.  She said she had not eaten much today and [facility]  She was at the her salon when she acutely felt some upper abdominal pain and nausea and went to the bathroom.  She said she was sitting on the toilet when she fainted and woke up on the floor being assisted by salon staff.  No reported seizure activity.  All of her pain is resolved and she feels back to baseline.     The history is provided by the patient.  Loss of Consciousness  Episode history:  Single Most recent episode:  Today Progression:  Resolved Chronicity:  New Context: bowel movement and urination   Witnessed: no   Relieved by:  Certain positions Worsened by:  Nothing Ineffective treatments:  None tried Associated symptoms: nausea   Associated symptoms: no chest pain, no fever, no recent fall, no rectal bleeding, no seizures, no shortness of breath and no vomiting     Past Medical History:  Diagnosis Date  . Arthritis   . Asthma   . Chicken pox   . Colon polyp   . Diverticulitis   . Gallstones   . GERD (gastroesophageal reflux disease)   . High cholesterol   . Hypertension   . UTI (urinary tract infection)     Patient Active Problem List   Diagnosis Date Noted  . Myopia of both eyes 04/14/2012    Past Surgical History:  Procedure Laterality Date  . APPENDECTOMY  2001  . BREAST BIOPSY  1987, 1993  . CHOLECYSTECTOMY  1993  . COLONOSCOPY  08/29/2014  . [place]   lost right tube   . ESOPHAGOGASTRODUODENOSCOPY  2000  . REPLACEMENT TOTAL KNEE Bilateral 2016 and 2017     OB History   No obstetric history on file.       Home Medications    Prior to Admission medications   Medication Sig Start Date End Date Taking? Authorizing Provider  AMBULATORY NON FORMULARY MEDICATION Medication Name: Osteo Bi-Flex Glucosamine Chondroitin 2 per day    [provider]  aspirin EC 81 MG tablet Take 81 mg by mouth daily.    [provider]  calcium citrate-vitamin D (CITRACAL+D) 315-200 MG-UNIT tablet Take 1 tablet by mouth 2 (two) times daily.    [provider]  Cholecalciferol (VITAMIN D3) 50 MCG (2000 UT) CHEW Chew by mouth.    [provider]  Fluticasone-Salmeterol (WIXELA INHUB) 100-50 MCG/DOSE AEPB Inhale 1 puff into the lungs daily.    [provider]  gabapentin (NEURONTIN) 100 MG capsule TAKE 1 CAPSULE (100 MG TOTAL) BY MOUTH AT BEDTIME. INCREASE AS TOLERATED UP TO THREE TIMES A DAY 11/11/18   Briscoe Deutscher, DO  hydrochlorothiazide (HYDRODIURIL) 50 MG tablet Take 1 tablet (50 mg total) by mouth daily. 10/21/18   Briscoe Deutscher, DO  losartan (COZAAR) 25 MG tablet TAKE 2 TABLETS BY MOUTH EVERY DAY 11/15/18   Briscoe Deutscher, DO  meloxicam (MOBIC) 15 MG tablet TAKE 1 TABLET BY MOUTH EVERY DAY 11/08/18   Briscoe Deutscher, DO  Multiple Vitamins-Minerals (PRESERVISION AREDS  2 PO) Take by mouth 2 (two) times daily.    [provider]  Omega-3 Fatty Acids (FISH OIL) 1200 MG CPDR Take 1,200 mg by mouth 4 (four) times daily.    [provider]  omeprazole (PRILOSEC) 20 MG capsule Take 20 mg by mouth daily.    [provider]  PEG-KCl-NaCl-NaSulf-Na Asc-C (PLENVU) 140 g SOLR Take 140 g by mouth as directed. 11/09/18   Doran Stabler, MD  Red Yeast Rice 600 MG CAPS Take by mouth.    [provider]    Family History Family History  Problem Relation Age of Onset  . Hypertension Mother   . Breast cancer Mother   . Colon cancer Mother   . Lung cancer Father   . Prostate cancer Brother   . Hypertension Brother   . Early death Maternal  Grandfather   . Early death Brother     Social History Social History   Tobacco Use  . Smoking status: Never Smoker  . Smokeless tobacco: Never Used  Substance Use Topics  . Alcohol use: Never    Frequency: Never  . Drug use: Never     Allergies   Latex and Penicillins   Review of Systems Review of Systems  Constitutional: Negative for fever.  HENT: Negative for sore throat.   Eyes: Negative for visual disturbance.  Respiratory: Negative for shortness of breath.   Cardiovascular: Positive for syncope. Negative for chest pain.  Gastrointestinal: Positive for nausea. Negative for abdominal pain and vomiting.  Genitourinary: Negative for dysuria.  Musculoskeletal: Negative for neck pain.  Skin: Negative for rash.  Neurological: Positive for syncope. Negative for seizures.     Physical Exam Updated Vital Signs BP (!) 149/71 (BP Location: Right Arm)   Pulse 69   Temp 97.6 F (36.4 C) (Oral)   Resp 18   Ht 5\' 5"  (1.651 m)   Wt 82.5 kg   SpO2 100%   BMI 30.27 kg/m   Physical Exam Vitals signs and nursing note reviewed.  Constitutional:      General: She is not in acute distress.    Appearance: She is well-developed.  HENT:     Head: Normocephalic and atraumatic.  Eyes:     Conjunctiva/sclera: Conjunctivae normal.  Neck:     Musculoskeletal: Neck supple.  Cardiovascular:     Rate and Rhythm: Normal rate and regular rhythm.     Heart sounds: No murmur.  Pulmonary:     Effort: Pulmonary effort is normal. No respiratory distress.     Breath sounds: Normal breath sounds.  Abdominal:     Palpations: Abdomen is soft.     Tenderness: There is no abdominal tenderness.  Musculoskeletal: Normal range of motion.        General: No tenderness or signs of injury.     Right lower leg: No edema.     Left lower leg: No edema.  Skin:    General: Skin is warm and dry.     Capillary Refill: Capillary refill takes less than 2 seconds.  Neurological:     General: No  focal deficit present.     Mental Status: She is alert and oriented to person, place, and time.     Sensory: No sensory deficit.     Motor: No weakness.      ED Treatments / Results  Labs (all labs ordered are listed, but only abnormal results are displayed) Labs Reviewed  BASIC METABOLIC PANEL - Abnormal; Notable for  the following components:      Result Value   Potassium 3.0 (*)    BUN 26 (*)    Calcium 8.1 (*)    All other components within normal limits  CBC WITH DIFFERENTIAL/PLATELET - Abnormal; Notable for the following components:   RBC 3.15 (*)    Hemoglobin 9.6 (*)    HCT 30.2 (*)    Eosinophils Absolute 0.6 (*)    All other components within normal limits  CBG MONITORING, ED - Abnormal; Notable for the following components:   Glucose-Capillary 100 (*)    All other components within normal limits  URINALYSIS, ROUTINE W REFLEX MICROSCOPIC  I-STAT TROPONIN, ED  POC OCCULT BLOOD, ED    EKG EKG Interpretation  Date/Time:  Thursday November 18 2018 14:39:00 EST Ventricular Rate:  71 PR Interval:    QRS Duration: 108 QT Interval:  400 QTC Calculation: 435 R Axis:   47 Text Interpretation:  Sinus rhythm Ventricular premature complex RSR' in V1 or V2, right VCD or RVH no prior to compare with Confirmed by Aletta Edouard [phone]) on 11/18/2018 2:41:16 PM   Radiology No results found.  Procedures Procedures (including critical care time)  Medications Ordered in ED Medications  potassium chloride SA (K-DUR,KLOR-CON) CR tablet 40 mEq (has no administration in time range)  sodium chloride 0.9 % bolus 1,000 mL (0 mLs Intravenous Stopped 11/18/18 1636)     Initial Impression / Assessment and Plan / ED Course  I have reviewed the triage vital signs and the nursing notes.  Pertinent labs & imaging results that were available during my care of the patient were reviewed by me and considered in my medical decision making (see chart for details).  Clinical Course as of Nov 18 1731  Thu Nov 18, 2018  1515 Patient with an unwitnessed syncopal event while on the toilet after experiencing some upper abdominal pain.  She feels back to baseline here now.   [MB]  1696   Her lab work is significant for a drop in her hemoglobin from 4 months ago.  I just did a rectal exam on her with Megan as chaperone.  Normal sphincter tone no masses stool in vault.  Sample sent for guaiac.   [MB]  7893 She said she is due for a colonoscopy with low Exie Parody Dr. Loletha Carrow because she is been having some crampy low abdominal pain for few months.   [MB]  8101 By [facility] syncope rules the patient is low risk.   [MB]    Clinical Course User Index [MB] Hayden Rasmussen, MD        Final Clinical Impressions(s) / ED Diagnoses   Final diagnoses:  Syncope and collapse    ED Discharge Orders    None       Hayden Rasmussen, MD 11/18/18 1734

## 2018-11-22 ENCOUNTER — Telehealth: Payer: Self-pay | Admitting: Family Medicine

## 2018-11-22 NOTE — Telephone Encounter (Signed)
See note, please advise on if patient can be worked in this week with Dr. Juleen China or needs to see another provider

## 2018-11-22 NOTE — Telephone Encounter (Signed)
Pt is coming in 3/11 at 11:20 am

## 2018-11-22 NOTE — Telephone Encounter (Signed)
Copied from [place] [id number]. Topic: Quick Communication - See Telephone Encounter >> Nov 22, 2018  8:39 AM Robina Ade, Helene Kelp D wrote: CRM for notification. See Telephone encounter for: 11/22/18. Patient called and said that she was at the ER on Thursday due to she fainted while in the bathroom.  They told her she had low himoglobin and low iron and would like to talk to Dr. Juleen China about this. There wasn't any appointment available this week for her to come in. Please call patient back, thanks

## 2018-11-22 NOTE — Telephone Encounter (Signed)
Can you call and put in same day?

## 2018-11-22 NOTE — Telephone Encounter (Signed)
Noted  

## 2018-11-24 ENCOUNTER — Other Ambulatory Visit: Payer: Self-pay

## 2018-11-24 ENCOUNTER — Ambulatory Visit (HOSPITAL_COMMUNITY)
Admission: RE | Admit: 2018-11-24 | Discharge: 2018-11-24 | Disposition: A | Discharge status: Home / Self Care | Payer: Medicare Other | Source: Ambulatory Visit | Attending: Family Medicine | Admitting: Family Medicine

## 2018-11-24 ENCOUNTER — Ambulatory Visit: Payer: Medicare Other | Admitting: Family Medicine

## 2018-11-24 ENCOUNTER — Encounter: Payer: Self-pay | Admitting: Family Medicine

## 2018-11-24 VITALS — BP 120/76 | HR 76 | Temp 98.6°F | Ht 65.0 in | Wt 182.2 lb

## 2018-11-24 DIAGNOSIS — R55 Syncope and collapse: Secondary | ICD-10-CM | POA: Diagnosis present

## 2018-11-24 DIAGNOSIS — R1011 Right upper quadrant pain: Secondary | ICD-10-CM | POA: Diagnosis present

## 2018-11-24 DIAGNOSIS — R5383 Other fatigue: Secondary | ICD-10-CM

## 2018-11-24 DIAGNOSIS — R5381 Other malaise: Secondary | ICD-10-CM

## 2018-11-24 DIAGNOSIS — D649 Anemia, unspecified: Secondary | ICD-10-CM

## 2018-11-24 DIAGNOSIS — K219 Gastro-esophageal reflux disease without esophagitis: Secondary | ICD-10-CM

## 2018-11-24 DIAGNOSIS — K5732 Diverticulitis of large intestine without perforation or abscess without bleeding: Secondary | ICD-10-CM

## 2018-11-24 DIAGNOSIS — E538 Deficiency of other specified B group vitamins: Secondary | ICD-10-CM

## 2018-11-24 LAB — COMPREHENSIVE METABOLIC PANEL
ALT: 21 U/L (ref 0–35)
AST: 19 U/L (ref 0–37)
Albumin: 3.8 g/dL (ref 3.5–5.2)
Alkaline Phosphatase: 83 U/L (ref 39–117)
BUN: 30 mg/dL — ABNORMAL HIGH (ref 6–23)
CO2: 31 mEq/L (ref 19–32)
Calcium: 10.1 mg/dL (ref 8.4–10.5)
Chloride: 101 mEq/L (ref 96–112)
Creatinine, Ser: 0.77 mg/dL (ref 0.40–1.20)
GFR: 71.55 mL/min (ref >60.00)
Glucose, Bld: 101 mg/dL — ABNORMAL HIGH (ref 70–99)
Potassium: 4.5 mEq/L (ref 3.5–5.1)
Sodium: 140 mEq/L (ref 135–145)
Total Bilirubin: 0.6 mg/dL (ref 0.2–1.2)
Total Protein: 6.3 g/dL (ref 6.0–8.3)

## 2018-11-24 LAB — CBC WITH DIFFERENTIAL/PLATELET
Basophils Absolute: 0.1 10*3/uL (ref 0.0–0.1)
Basophils Relative: 0.9 % (ref 0.0–3.0)
Eosinophils Absolute: 0.8 10*3/uL — ABNORMAL HIGH (ref 0.0–0.7)
Eosinophils Relative: 8.4 % — ABNORMAL HIGH (ref 0.0–5.0)
HCT: 33.8 % — ABNORMAL LOW (ref 36.0–46.0)
Hemoglobin: 11.6 g/dL — ABNORMAL LOW (ref 12.0–15.0)
Lymphocytes Relative: 20.9 % (ref 12.0–46.0)
Lymphs Abs: 1.9 10*3/uL (ref 0.7–4.0)
MCHC: 34.4 g/dL (ref 30.0–36.0)
MCV: 91.5 fl (ref 78.0–100.0)
Monocytes Absolute: 0.6 10*3/uL (ref 0.1–1.0)
Monocytes Relative: 6.6 % (ref 3.0–12.0)
Neutro Abs: 5.7 10*3/uL (ref 1.4–7.7)
Neutrophils Relative %: 63.2 % (ref 43.0–77.0)
Platelets: 275 10*3/uL (ref 150.0–400.0)
RBC: 3.7 Mil/uL — ABNORMAL LOW (ref 3.87–5.11)
RDW: 12.8 % (ref 11.5–15.5)
WBC: 9 10*3/uL (ref 4.0–10.5)

## 2018-11-24 LAB — IBC + FERRITIN
Ferritin: 62.3 ng/mL (ref 10.0–291.0)
Iron: 80 ug/dL (ref 42–145)
Saturation Ratios: 23.6 % (ref 20.0–50.0)
Transferrin: 242 mg/dL (ref 212.0–360.0)

## 2018-11-24 LAB — VITAMIN B12: Vitamin B-12: 266 pg/mL (ref 211–911)

## 2018-11-24 LAB — MAGNESIUM: Magnesium: 1.8 mg/dL (ref 1.5–2.5)

## 2018-11-24 LAB — LIPASE: Lipase: 20 U/L (ref 11.0–59.0)

## 2018-11-24 MED ORDER — IOHEXOL 300 MG/ML  SOLN
100.0000 mL | Freq: Once | INTRAMUSCULAR | Status: AC | PRN
  Administered 2018-11-24: 100 mL via INTRAVENOUS

## 2018-11-24 MED ORDER — SODIUM CHLORIDE (PF) 0.9 % IJ SOLN
INTRAMUSCULAR | Status: AC
  Filled 2018-11-24: qty 50

## 2018-11-24 MED ORDER — IOHEXOL 300 MG/ML  SOLN
30.0000 mL | Freq: Once | INTRAMUSCULAR | Status: AC | PRN
  Administered 2018-11-24: 30 mL via ORAL

## 2018-11-25 ENCOUNTER — Encounter: Payer: Self-pay | Admitting: Family Medicine

## 2018-11-25 MED ORDER — CIPROFLOXACIN HCL 500 MG PO TABS: 500.0000 mg | ORAL_TABLET | Freq: Two times a day (BID) | ORAL | 0 refills | Status: AC

## 2018-11-25 MED ORDER — METRONIDAZOLE 500 MG PO TABS: 500.0000 mg | ORAL_TABLET | Freq: Three times a day (TID) | ORAL | 0 refills | Status: DC

## 2018-11-25 NOTE — Progress Notes (Signed)
Renee Green is a 85 y.o. female is here for follow up.  History of Present Illness:   HPI: Patient presents for emergency room follow-up.  Patient states that she was at her hair salon when she felt fairly significant pain in her abdomen, points to the right middle to upper quadrant.  She excused herself and went to the bathroom.  When she sat on the toilet, she felt lightheaded.  She stood and had syncopal episode.  EMS were called.  Patient was seen in the emergency department.  She had some lab abnormalities that were reviewed today.  She was given 3 L of fluids and felt better.  Today, she still has some right upper quadrant discomfort.  She looks more sleepy than normal.  She has been drinking fluids.  History of cholecystectomy.  History of colonoscopy but has been told no diverticulosis.  She denies any current nausea, vomiting, diarrhea, constipation.  No blood in stools.  She did have a stool guaiac that came back negative at the emergency department.  Long-term reflux.  Concern for peptic ulcer disease.  No history of EGD.  Not on chronic medication for stomach.  No fevers or unintentional weight loss.  Health Maintenance Due  Topic Date Due  . TETANUS/TDAP  08/22/1952  . DEXA SCAN  08/22/2000  . PNA vac Low Risk Adult (1 of 2 - PCV13) 08/22/2000   Depression screen PHQ 2/9 10/20/2018  Decreased Interest 0  Down, Depressed, Hopeless 0  PHQ - 2 Score 0  Altered sleeping 0  Tired, decreased energy 1  Change in appetite 1  Feeling bad or failure about yourself  0  Trouble concentrating 0  Moving slowly or fidgety/restless 0  Suicidal thoughts 0  PHQ-9 Score 2   PMHx, SurgHx, SocialHx, FamHx, Medications, and Allergies were reviewed in the Visit Navigator and updated as appropriate.   Patient Active Problem List   Diagnosis Date Noted  . Myopia of both eyes 04/14/2012   Social History   Tobacco Use  . Smoking status: Never Smoker  . Smokeless tobacco: Never Used    Substance Use Topics  . Alcohol use: Never    Frequency: Never  . Drug use: Never   Current Medications and Allergies   .  aspirin EC 81 MG tablet, Take 81 mg by mouth 2 (two) times daily. , Disp: , Rfl:  .  Boswellia-Glucosamine-Vit D (OSTEO BI-FLEX ONE PER DAY PO), Take 1 tablet by mouth 2 (two) times daily., Disp: , Rfl:  .  calcium citrate-vitamin D (CITRACAL+D) 315-200 MG-UNIT tablet, Take 1 tablet by mouth 2 (two) times daily., Disp: , Rfl:  .  Cholecalciferol (VITAMIN D3) 50 MCG (2000 UT) CHEW, Chew 1 tablet by mouth 2 (two) times daily. , Disp: , Rfl:  .  fluticasone (FLONASE) 50 MCG/ACT nasal spray, Place 1 spray into both nostrils daily., Disp: , Rfl:  .  Fluticasone-Salmeterol (WIXELA INHUB) 100-50 MCG/DOSE AEPB, Inhale 1 puff into the lungs daily., Disp: , Rfl:  .  gabapentin (NEURONTIN) 100 MG capsule, TAKE 1 CAPSULE (100 MG TOTAL) BY MOUTH AT BEDTIME. INCREASE AS TOLERATED UP TO THREE TIMES A DAY (Patient taking differently: Take 300 mg by mouth at bedtime. ), Disp: 270 capsule, Rfl: 2 .  hydrochlorothiazide (HYDRODIURIL) 50 MG tablet, Take 1 tablet (50 mg total) by mouth daily., Disp: 90 tablet, Rfl: 3 .  losartan (COZAAR) 25 MG tablet, TAKE 2 TABLETS BY MOUTH EVERY DAY (Patient taking differently: Take 50 mg by  mouth daily. ), Disp: 60 tablet, Rfl: 0 .  meloxicam (MOBIC) 15 MG tablet, TAKE 1 TABLET BY MOUTH EVERY DAY (Patient taking differently: Take 15 mg by mouth daily. ), Disp: 30 tablet, Rfl: 0 .  Multiple Vitamins-Minerals (PRESERVISION AREDS 2 PO), Take 1 tablet by mouth 2 (two) times daily. , Disp: , Rfl:  .  Omega-3 Fatty Acids (FISH OIL) 1200 MG CPDR, Take 2 tablets by mouth 2 (two) times daily. , Disp: , Rfl:  .  omeprazole (PRILOSEC) 20 MG capsule, Take 20 mg by mouth daily., Disp: , Rfl:  .  PEG-KCl-NaCl-NaSulf-Na Asc-C (PLENVU) 140 g SOLR, Take 140 g by mouth as directed., Disp: 1 each, Rfl: 0 .  Red Yeast Rice 600 MG CAPS, Take 600 mg by mouth 2 (two) times daily.  , Disp: , Rfl:    Allergies  Allergen Reactions  . Latex Other (See Comments)    Blister   . Penicillins Rash   Review of Systems   Pertinent items are noted in the HPI. Otherwise, a complete ROS is negative.  Vitals   Vitals:   11/24/18 1112  BP: 120/76  Pulse: 76  Temp: 98.6 F (37 C)  TempSrc: Oral  SpO2: 98%  Weight: 182 lb 3.2 oz (82.6 kg)  Height: 5\' 5"  (1.651 m)     Body mass index is 30.32 kg/m.  Physical Exam   Physical Exam Vitals signs and nursing note reviewed.  Constitutional:      Appearance: Normal appearance. She is ill-appearing.  HENT:     Head: Normocephalic and atraumatic.  Eyes:     Pupils: Pupils are equal, round, and reactive to light.  Neck:     Musculoskeletal: Normal range of motion and neck supple.  Cardiovascular:     Rate and Rhythm: Normal rate and regular rhythm.     Heart sounds: Normal heart sounds.  Pulmonary:     Effort: Pulmonary effort is normal.  Abdominal:     Palpations: Abdomen is soft.     Tenderness: There is abdominal tenderness in the right upper quadrant and epigastric area.  Skin:    General: Skin is warm.  Neurological:     Mental Status: She is alert.  Psychiatric:        Behavior: Behavior normal.     Results for orders placed or performed in visit on 11/24/18  CBC with Differential/Platelet  Result Value Ref Range   WBC 9.0 4.0 - 10.5 K/uL   RBC 3.70 (L) 3.87 - 5.11 Mil/uL   Hemoglobin 11.6 (L) 12.0 - 15.0 g/dL   HCT 33.8 (L) 36.0 - 46.0 %   MCV 91.5 78.0 - 100.0 fl   MCHC 34.4 30.0 - 36.0 g/dL   RDW 12.8 11.5 - 15.5 %   Platelets 275.0 150.0 - 400.0 K/uL   Neutrophils Relative % 63.2 43.0 - 77.0 %   Lymphocytes Relative 20.9 12.0 - 46.0 %   Monocytes Relative 6.6 3.0 - 12.0 %   Eosinophils Relative 8.4 (H) 0.0 - 5.0 %   Basophils Relative 0.9 0.0 - 3.0 %   Neutro Abs 5.7 1.4 - 7.7 K/uL   Lymphs Abs 1.9 0.7 - 4.0 K/uL   Monocytes Absolute 0.6 0.1 - 1.0 K/uL   Eosinophils Absolute 0.8 (H)  0.0 - 0.7 K/uL   Basophils Absolute 0.1 0.0 - 0.1 K/uL  Comprehensive metabolic panel  Result Value Ref Range   Sodium 140 135 - 145 mEq/L   Potassium 4.5 3.5 -  5.1 mEq/L   Chloride 101 96 - 112 mEq/L   CO2 31 19 - 32 mEq/L   Glucose, Bld 101 (H) 70 - 99 mg/dL   BUN 30 (H) 6 - 23 mg/dL   Creatinine, Ser 0.77 0.40 - 1.20 mg/dL   Total Bilirubin 0.6 0.2 - 1.2 mg/dL   Alkaline Phosphatase 83 39 - 117 U/L   AST 19 0 - 37 U/L   ALT 21 0 - 35 U/L   Total Protein 6.3 6.0 - 8.3 g/dL   Albumin 3.8 3.5 - 5.2 g/dL   Calcium 10.1 8.4 - 10.5 mg/dL   GFR 71.55 >60.00 mL/min  Magnesium  Result Value Ref Range   Magnesium 1.8 1.5 - 2.5 mg/dL  IBC + Ferritin  Result Value Ref Range   Iron 80 42 - 145 ug/dL   Transferrin 242.0 212.0 - 360.0 mg/dL   Saturation Ratios 23.6 20.0 - 50.0 %   Ferritin 62.3 10.0 - 291.0 ng/mL  Vitamin B12  Result Value Ref Range   Vitamin B-12 266 211 - 911 pg/mL  Lipase  Result Value Ref Range   Lipase 20.0 11.0 - 59.0 U/L    Assessment and Plan   Abrar was seen today for hospitalization follow-up.  Diagnoses and all orders for this visit:  RUQ pain Comments: Labs and imaging today.  Original concern for possible peptic ulcer or diverticulitis. Orders: -     CBC with Differential/Platelet -     Comprehensive metabolic panel -     Magnesium -     IBC + Ferritin -     Lipase -     Cancel: CT ABDOMEN PELVIS W CONTRAST; Future -     Cancel: CT ABDOMEN PELVIS W CONTRAST; Future -     CT Abdomen Pelvis W Contrast; Future  Syncope and collapse Comments: Consistent with vasovagal syncope due to pain. Orders: -     Cancel: CT ABDOMEN PELVIS W CONTRAST; Future -     Cancel: CT ABDOMEN PELVIS W CONTRAST; Future  Diverticulitis of large intestine without perforation or abscess without bleeding Comments: New.  Allergy to penicillin.  Will treat with Cipro and Flagyl.  We will also have her follow-up with gastroenterology for possible future  EGD. Orders: -     ciprofloxacin (CIPRO) 500 MG tablet; Take 1 tablet (500 mg total) by mouth 2 (two) times daily for 7 days. -     metroNIDAZOLE (FLAGYL) 500 MG tablet; Take 1 tablet (500 mg total) by mouth 3 (three) times daily.  Gastroesophageal reflux disease, esophagitis presence not specified -     Ambulatory referral to Gastroenterology  Malaise and fatigue  Anemia, unspecified type -     CBC with Differential/Platelet -     IBC + Ferritin -     Vitamin B12  B12 deficiency    . Orders and follow up as documented in [place], reviewed diet, exercise and weight control, cardiovascular risk and specific lipid/LDL goals reviewed, reviewed medications and side effects in detail.  . Reviewed expectations re: course of current medical issues. . Outlined signs and symptoms indicating need for more acute intervention. . Patient verbalized understanding and all questions were answered. . Patient received an After Visit Summary.   Briscoe Deutscher, DO [place], [facility] 11/25/2018

## 2018-11-30 ENCOUNTER — Telehealth: Payer: Self-pay | Admitting: Family Medicine

## 2018-11-30 NOTE — Telephone Encounter (Signed)
Patient states woke up in middle of night with abdominal cramps and watery to loose stools. She has not seen any blood, or tarry change in stool. She has had 4-5 bowel movements before 5 am and none after. She states that the abdominal pain was like child birth. States that after she "had that blow out" pain improved and now is just uncomfortable.

## 2018-11-30 NOTE — Telephone Encounter (Signed)
See note

## 2018-11-30 NOTE — Telephone Encounter (Signed)
Stop antibiotics. Hydrate with electrolytes. Eat yogurt. Check in with patient in 1-2 days. Sounds like antibiotic associated diarrhea that cleaned her out, but let's make sure. Warn her that she may still have loose stools for the rest of the week.

## 2018-11-30 NOTE — Telephone Encounter (Signed)
Called patient reviewed all information and repeated back to me. Will call if any questions.   Will call and check on patient in a few days.

## 2018-11-30 NOTE — Telephone Encounter (Signed)
Copied from [place] [phone]. Topic: General - Other >> Nov 30, 2018 10:14 AM Keene Breath wrote: Reason for CRM: Patient called to inform the doctor that she believes the antibiotic that was prescribed is causing bad side effects.  Patient said she has headaches, abdominal pain and diarrhea ever since she started taking the antibiotic last week.  Please advise and call patient to let her know if she should continue.  CB# [phone] Cell, or [phone]

## 2018-12-03 ENCOUNTER — Telehealth: Payer: Self-pay | Admitting: Gastroenterology

## 2018-12-03 NOTE — Telephone Encounter (Signed)
Called patient she is feeling little better still having some loose stools yesterday but feeling much better.

## 2018-12-03 NOTE — Telephone Encounter (Signed)
Hi Dr. Loletha Carrow, this pt just cancelled her colonoscopy that was scheduled on 3/24 with you due to concerns about Covid 19. She stated that she suffers from asthma and prefers to wait. Thank you.

## 2018-12-04 NOTE — Telephone Encounter (Signed)
I agree, so she should continue imodium as needed for now.  Please set a clinical reminder for 2 months.

## 2018-12-06 ENCOUNTER — Other Ambulatory Visit: Payer: Self-pay | Admitting: Family Medicine

## 2018-12-06 DIAGNOSIS — M2550 Pain in unspecified joint: Secondary | ICD-10-CM

## 2018-12-06 NOTE — Telephone Encounter (Signed)
Last OV 11/24/2018 Per OV note - Concern for peptic ulcer disease.  Forwarding to Dr. Juleen China.

## 2018-12-06 NOTE — Telephone Encounter (Signed)
Pt informed and reminder set for 2 months.

## 2018-12-07 ENCOUNTER — Encounter: Payer: Medicare Other | Admitting: Gastroenterology

## 2018-12-12 ENCOUNTER — Other Ambulatory Visit: Payer: Self-pay | Admitting: Family Medicine

## 2019-01-18 ENCOUNTER — Encounter: Payer: Self-pay | Admitting: Family Medicine

## 2019-01-18 DIAGNOSIS — K219 Gastro-esophageal reflux disease without esophagitis: Secondary | ICD-10-CM | POA: Insufficient documentation

## 2019-01-18 DIAGNOSIS — Z9889 Other specified postprocedural states: Secondary | ICD-10-CM | POA: Insufficient documentation

## 2019-01-18 DIAGNOSIS — J45909 Unspecified asthma, uncomplicated: Secondary | ICD-10-CM | POA: Insufficient documentation

## 2019-01-18 NOTE — Progress Notes (Signed)
Virtual Visit via Video   Due to the COVID-19 pandemic, this visit was completed with telemedicine (audio/video) technology to reduce patient and provider exposure as well as to preserve personal protective equipment.   I connected with Aurea Graff by a video enabled telemedicine application and verified that I am speaking with the correct person using two identifiers. Location patient: Home Location provider: [facility], Office Persons participating in the virtual visit: Lois Slagel, Briscoe Deutscher, DO   I discussed the limitations of evaluation and management by telemedicine and the availability of in person appointments. The patient expressed understanding and agreed to proceed.  Care Team   Patient Care Team: Briscoe Deutscher, DO as PCP - General (Family Medicine)  Subjective:   HPI: Patients last appointment in office was follow up previous visit, Dx diverticulitis. Doing well. No pain or diarrhea. Sadly, her husband died just over a month ago. Died in his recliner, fell asleep and didn't wake. Daughters have been great. 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 Unable to have a funeral yet. Feels strong. Taking medication. Exercising.   Review of Systems  Constitutional: Negative for chills, fever, malaise/fatigue and weight loss.  Respiratory: Negative for cough, shortness of breath and wheezing.   Cardiovascular: Negative for chest pain, palpitations and leg swelling.  Gastrointestinal: Negative for abdominal pain, constipation, diarrhea, nausea and vomiting.  Genitourinary: Negative for dysuria and urgency.  Musculoskeletal: Negative for joint pain and myalgias.  Skin: Negative for rash.  Neurological: Negative for dizziness and headaches.  Psychiatric/Behavioral: Positive for depression. Negative for substance abuse and suicidal ideas. The patient is nervous/anxious.     Patient Active Problem List   Diagnosis  Date Noted  . Diverticulosis 01/19/2019  . B12 deficiency 01/19/2019  . Grief 01/19/2019  . Asthma 01/18/2019  . GERD (gastroesophageal reflux disease) 01/18/2019  . History of cardiac catheterization 01/18/2019  . Essential hypertension 06/25/2015  . Mild intermittent asthma 06/25/2015  . Primary osteoarthritis of left knee 06/18/2015  . Myopia of both eyes 04/14/2012    Social History   Tobacco Use  . Smoking status: Never Smoker  . Smokeless tobacco: Never Used  Substance Use Topics  . Alcohol use: Never    Frequency: Never    Current Outpatient Medications:  .  aspirin EC 81 MG tablet, Take 81 mg by mouth 2 (two) times daily. , Disp: , Rfl:  .  Boswellia-Glucosamine-Vit D (OSTEO BI-FLEX ONE PER DAY PO), Take 1 tablet by mouth 2 (two) times daily., Disp: , Rfl:  .  calcium citrate-vitamin D (CITRACAL+D) 315-200 MG-UNIT tablet, Take 1 tablet by mouth 2 (two) times daily., Disp: , Rfl:  .  Cholecalciferol (VITAMIN D3) 50 MCG (2000 UT) CHEW, Chew 1 tablet by mouth 2 (two) times daily. , Disp: , Rfl:  .  fluticasone (FLONASE) 50 MCG/ACT nasal spray, Place 1 spray into both nostrils daily., Disp: , Rfl:  .  Fluticasone-Salmeterol (WIXELA INHUB) 100-50 MCG/DOSE AEPB, Inhale 1 puff into the lungs daily., Disp: , Rfl:  .  gabapentin (NEURONTIN) 100 MG capsule, TAKE 1 CAPSULE (100 MG TOTAL) BY MOUTH AT BEDTIME. INCREASE AS TOLERATED UP TO THREE TIMES A DAY (Patient taking differently: Take 300 mg by mouth at bedtime. ), Disp: 270 capsule, Rfl: 2 .  hydrochlorothiazide (HYDRODIURIL) 50 MG tablet, Take 1 tablet (50 mg total) by mouth daily., Disp: 90 tablet, Rfl: 3 .  losartan (COZAAR) 25 MG tablet, Take 2 tablets (50 mg total)  by mouth daily., Disp: 180 tablet, Rfl: 2 .  meloxicam (MOBIC) 15 MG tablet, Take 1 tablet (15 mg total) by mouth daily., Disp: 90 tablet, Rfl: 0 .  Multiple Vitamins-Minerals (PRESERVISION AREDS 2 PO), Take 1 tablet by mouth 2 (two) times daily. , Disp: , Rfl:  .   Omega-3 Fatty Acids (FISH OIL) 1200 MG CPDR, Take 2 tablets by mouth 2 (two) times daily. , Disp: , Rfl:  .  omeprazole (PRILOSEC) 20 MG capsule, Take 20 mg by mouth daily., Disp: , Rfl:  .  Red Yeast Rice 600 MG CAPS, Take 600 mg by mouth 2 (two) times daily. , Disp: , Rfl:  .  vitamin B-12 (CYANOCOBALAMIN) 1000 MCG tablet, Take 1,000 mcg by mouth daily., Disp: , Rfl:   Allergies  Allergen Reactions  . Latex Other (See Comments)    Blister   . Penicillins Rash    Objective:   VITALS: Per patient if applicable, see vitals. GENERAL: Alert and in no acute distress. CARDIOPULMONARY: No increased WOB. Speaking in clear sentences.  PSYCH: Pleasant and cooperative. Speech normal rate and rhythm. Affect is appropriate. Insight and judgement are appropriate. Attention is focused, linear, and appropriate.  NEURO: Oriented as arrived to appointment on time with no prompting.   Depression screen [facility] 2/9 10/20/2018  Decreased Interest 0  Down, Depressed, Hopeless 0  PHQ - 2 Score 0  Altered sleeping 0  Tired, decreased energy 1  Change in appetite 1  Feeling bad or failure about yourself  0  Trouble concentrating 0  Moving slowly or fidgety/restless 0  Suicidal thoughts 0  PHQ-9 Score 2    Assessment and Plan:   Cherysh was seen today for follow-up.  Diagnoses and all orders for this visit:  Essential hypertension Comments: Controlled. Continue current treatment.   Diverticulosis Comments: Diverticulitis has resolved.   Mild intermittent asthma without complication Comments: No issues. Compliant. Continue current regimen.   B12 deficiency Comments: Taking B12.  Grief Comments: Offered support if needed.    Marland Kitchen COVID-19 Education: The signs and symptoms of COVID-19 were discussed with the patient and how to seek care for testing if needed. The importance of social distancing was discussed today. . Reviewed expectations re: course of current medical issues. . Discussed  self-management of symptoms. . Outlined signs and symptoms indicating need for more acute intervention. . Patient verbalized understanding and all questions were answered. Marland Kitchen Health Maintenance issues including appropriate healthy diet, exercise, and smoking avoidance were discussed with patient. . See orders for this visit as documented in the electronic medical record.  Briscoe Deutscher, DO  Records requested if needed. Time spent: 25 minutes, of which >50% was spent in obtaining information about her symptoms, reviewing her previous labs, evaluations, and treatments, counseling her about her condition (please see the discussed topics above), and developing a plan to further investigate it; she had a number of questions which I addressed.

## 2019-01-19 ENCOUNTER — Other Ambulatory Visit: Payer: Self-pay

## 2019-01-19 ENCOUNTER — Ambulatory Visit (INDEPENDENT_AMBULATORY_CARE_PROVIDER_SITE_OTHER): Payer: Medicare Other | Admitting: Family Medicine

## 2019-01-19 ENCOUNTER — Encounter: Payer: Self-pay | Admitting: Family Medicine

## 2019-01-19 VITALS — Ht 65.0 in | Wt 182.0 lb

## 2019-01-19 DIAGNOSIS — J4520 Mild intermittent asthma, uncomplicated: Secondary | ICD-10-CM | POA: Diagnosis not present

## 2019-01-19 DIAGNOSIS — K5790 Diverticulosis of intestine, part unspecified, without perforation or abscess without bleeding: Secondary | ICD-10-CM | POA: Diagnosis not present

## 2019-01-19 DIAGNOSIS — I10 Essential (primary) hypertension: Secondary | ICD-10-CM

## 2019-01-19 DIAGNOSIS — E538 Deficiency of other specified B group vitamins: Secondary | ICD-10-CM

## 2019-01-19 DIAGNOSIS — F4321 Adjustment disorder with depressed mood: Secondary | ICD-10-CM

## 2019-02-05 ENCOUNTER — Other Ambulatory Visit: Payer: Self-pay | Admitting: Family Medicine

## 2019-03-01 ENCOUNTER — Other Ambulatory Visit: Payer: Self-pay | Admitting: Family Medicine

## 2019-03-01 DIAGNOSIS — M2550 Pain in unspecified joint: Secondary | ICD-10-CM

## 2019-04-04 ENCOUNTER — Other Ambulatory Visit: Payer: Self-pay | Admitting: Family Medicine

## 2019-04-27 ENCOUNTER — Other Ambulatory Visit: Payer: Self-pay

## 2019-04-27 ENCOUNTER — Encounter: Payer: Self-pay | Admitting: Family Medicine

## 2019-04-27 ENCOUNTER — Ambulatory Visit (INDEPENDENT_AMBULATORY_CARE_PROVIDER_SITE_OTHER): Payer: Medicare Other | Admitting: Family Medicine

## 2019-04-27 VITALS — Ht 65.0 in | Wt 181.4 lb

## 2019-04-27 DIAGNOSIS — R918 Other nonspecific abnormal finding of lung field: Secondary | ICD-10-CM

## 2019-04-27 DIAGNOSIS — M25551 Pain in right hip: Secondary | ICD-10-CM

## 2019-04-27 DIAGNOSIS — M5431 Sciatica, right side: Secondary | ICD-10-CM | POA: Diagnosis not present

## 2019-04-27 DIAGNOSIS — K219 Gastro-esophageal reflux disease without esophagitis: Secondary | ICD-10-CM

## 2019-04-27 DIAGNOSIS — I10 Essential (primary) hypertension: Secondary | ICD-10-CM

## 2019-04-27 DIAGNOSIS — Z1239 Encounter for other screening for malignant neoplasm of breast: Secondary | ICD-10-CM

## 2019-04-27 MED ORDER — OMEPRAZOLE 20 MG PO CPDR: 20.0000 mg | DELAYED_RELEASE_CAPSULE | Freq: Every day | ORAL | 3 refills | Status: DC

## 2019-04-27 MED ORDER — LOSARTAN POTASSIUM 50 MG PO TABS: 50.0000 mg | ORAL_TABLET | Freq: Every day | ORAL | 2 refills | Status: DC

## 2019-04-27 NOTE — Progress Notes (Signed)
Virtual Visit via Video   Due to the COVID-19 pandemic, this visit was completed with telemedicine (audio/video) technology to reduce patient and provider exposure as well as to preserve personal protective equipment.   I connected with Renee Green by a video enabled telemedicine application and verified that I am speaking with the correct person using two identifiers. Location patient: Home Location provider: [facility], Office Persons participating in the virtual visit: Renee Green, Renee Deutscher, DO Lonell Grandchild, CMA acting as scribe for Dr. Briscoe Green.   I discussed the limitations of evaluation and management by telemedicine and the availability of in person appointments. The patient expressed understanding and agreed to proceed.  Care Team   Patient Care Team: Renee Deutscher, DO as PCP - General (Family Medicine)  Subjective:   HPI: Doing well.  Staying busy.  Still grieving but working through it.  Becoming more independent every day.  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  She is hoping to spend a few hours each day helping a family that needs a person to spend a few hours with kids.  Right-sided low back pain that radiates to buttocks and down right leg.  About 2 months ago, patient woke up in the middle the night with severe pain.  She has been stretching nearly daily since that time and feels that has been helpful.  No other trauma.  History of bilateral knee replacement.  She says that the right knee just does not feel quite the same.  She is walking every day.  She is interested in pursuing physical therapy.  Due for mammogram and CT along recheck.  Due for medication refills.  Review of Systems  Constitutional: Negative for chills and fever.  HENT: Negative for hearing loss and tinnitus.   Eyes: Negative for blurred vision  and double vision.  Respiratory: Negative for cough and wheezing.   Cardiovascular: Negative for chest pain and palpitations.  Gastrointestinal: Negative for heartburn and nausea.  Genitourinary: Negative for dysuria and urgency.  Neurological: Negative for dizziness and headaches.  Psychiatric/Behavioral: Negative for depression and suicidal ideas.    Patient Active Problem List   Diagnosis Date Noted  . Lung nodule, multiple 04/27/2019  . Right sided sciatica 04/27/2019  . Right hip pain 04/27/2019  . Diverticulosis 01/19/2019  . B12 deficiency 01/19/2019  . Grief 01/19/2019  . Asthma 01/18/2019  . GERD (gastroesophageal reflux disease) 01/18/2019  . History of cardiac catheterization 01/18/2019  . Essential hypertension 06/25/2015  . Mild intermittent asthma 06/25/2015  . Primary osteoarthritis of left knee 06/18/2015  . Myopia of both eyes 04/14/2012    Social History   Tobacco Use  . Smoking status: Never Smoker  . Smokeless tobacco: Never Used  Substance Use Topics  . Alcohol use: Never    Frequency: Never    Current Outpatient Medications:  .  aspirin EC 81 MG tablet, Take 81 mg by mouth 2 (two) times daily. , Disp: , Rfl:  .  Boswellia-Glucosamine-Vit D (OSTEO BI-FLEX ONE PER DAY PO), Take 1 tablet by mouth 2 (two) times daily., Disp: , Rfl:  .  calcium citrate-vitamin D (CITRACAL+D) 315-200 MG-UNIT tablet, Take 1 tablet by mouth 2 (two) times daily., Disp: , Rfl:  .  Cholecalciferol (VITAMIN D3) 50 MCG (2000 UT) CHEW, Chew 1 tablet by mouth 2 (two) times daily. , Disp: ,  Rfl:  .  fluticasone (FLONASE) 50 MCG/ACT nasal spray, Place 1 spray into both nostrils daily., Disp: , Rfl:  .  Fluticasone-Salmeterol (WIXELA INHUB) 100-50 MCG/DOSE AEPB, INHALE 1 PUFF BY MOUTH EVERY DAY, Disp: 60 each, Rfl: 2 .  gabapentin (NEURONTIN) 100 MG capsule, TAKE 1 CAPSULE (100 MG TOTAL) BY MOUTH AT BEDTIME. INCREASE AS TOLERATED UP TO THREE TIMES A DAY (Patient taking differently: Take  300 mg by mouth at bedtime. ), Disp: 270 capsule, Rfl: 2 .  hydrochlorothiazide (HYDRODIURIL) 50 MG tablet, Take 1 tablet (50 mg total) by mouth daily., Disp: 90 tablet, Rfl: 3 .  losartan (COZAAR) 50 MG tablet, Take 1 tablet (50 mg total) by mouth daily., Disp: 90 tablet, Rfl: 2 .  meloxicam (MOBIC) 15 MG tablet, TAKE 1 TABLET BY MOUTH EVERY DAY, Disp: 90 tablet, Rfl: 0 .  Multiple Vitamins-Minerals (PRESERVISION AREDS 2 PO), Take 1 tablet by mouth 2 (two) times daily. , Disp: , Rfl:  .  Omega-3 Fatty Acids (FISH OIL) 1200 MG CPDR, Take 2 tablets by mouth 2 (two) times daily. , Disp: , Rfl:  .  omeprazole (PRILOSEC) 20 MG capsule, Take 1 capsule (20 mg total) by mouth daily., Disp: 90 capsule, Rfl: 3 .  Red Yeast Rice 600 MG CAPS, Take 600 mg by mouth 2 (two) times daily. , Disp: , Rfl:  .  vitamin B-12 (CYANOCOBALAMIN) 1000 MCG tablet, Take 1,000 mcg by mouth daily., Disp: , Rfl:   Allergies  Allergen Reactions  . Latex Other (See Comments)    Blister   . Penicillins Rash    Objective:   VITALS: Per patient if applicable, see vitals. GENERAL: Alert, appears well and in no acute distress. HEENT: Atraumatic, conjunctiva clear, no obvious abnormalities on inspection of external nose and ears. NECK: Normal movements of the head and neck. CARDIOPULMONARY: No increased WOB. Speaking in clear sentences. I:E ratio WNL.  MS: Moves all visible extremities without noticeable abnormality. PSYCH: Pleasant and cooperative, well-groomed. Speech normal rate and rhythm. Affect is appropriate. Insight and judgement are appropriate. Attention is focused, linear, and appropriate.  NEURO: CN grossly intact. Oriented as arrived to appointment on time with no prompting. Moves both UE equally.  SKIN: No obvious lesions, wounds, erythema, or cyanosis noted on face or hands.  Depression screen [facility] 2/9 10/20/2018  Decreased Interest 0  Down, Depressed, Hopeless 0  PHQ - 2 Score 0  Altered sleeping 0  Tired,  decreased energy 1  Change in appetite 1  Feeling bad or failure about yourself  0  Trouble concentrating 0  Moving slowly or fidgety/restless 0  Suicidal thoughts 0  PHQ-9 Score 2    Assessment and Plan:   Tiannah was seen today for follow-up.  Diagnoses and all orders for this visit:  Essential hypertension -     losartan (COZAAR) 50 MG tablet; Take 1 tablet (50 mg total) by mouth daily.  Gastroesophageal reflux disease, esophagitis presence not specified -     omeprazole (PRILOSEC) 20 MG capsule; Take 1 capsule (20 mg total) by mouth daily.  Right sided sciatica -     DG Lumbar Spine Complete; Future  Lung nodule, multiple -     CT Chest Wo Contrast; Future  Breast cancer screening -     MM DIGITAL SCREENING BILATERAL; Future  Right hip pain -     DG Hip Unilat W OR W/O Pelvis 2-3 Views Right; Future    . COVID-19 Education: The signs and symptoms of  COVID-19 were discussed with the patient and how to seek care for testing if needed. The importance of social distancing was discussed today. . Reviewed expectations re: course of current medical issues. . Discussed self-management of symptoms. . Outlined signs and symptoms indicating need for more acute intervention. . Patient verbalized understanding and all questions were answered. Marland Kitchen Health Maintenance issues including appropriate healthy diet, exercise, and smoking avoidance were discussed with patient. . See orders for this visit as documented in the electronic medical record.  Renee Deutscher, DO  Records requested if needed. Time spent: 25 minutes, of which >50% was spent in obtaining information about her symptoms, reviewing her previous labs, evaluations, and treatments, counseling her about her condition (please see the discussed topics above), and developing a plan to further investigate it; she had a number of questions which I addressed.

## 2019-04-28 ENCOUNTER — Other Ambulatory Visit: Payer: Medicare Other

## 2019-04-28 ENCOUNTER — Ambulatory Visit (INDEPENDENT_AMBULATORY_CARE_PROVIDER_SITE_OTHER): Payer: Medicare Other

## 2019-04-28 DIAGNOSIS — M25551 Pain in right hip: Secondary | ICD-10-CM | POA: Diagnosis not present

## 2019-04-28 DIAGNOSIS — M5431 Sciatica, right side: Secondary | ICD-10-CM | POA: Diagnosis not present

## 2019-05-02 ENCOUNTER — Encounter: Payer: Self-pay | Admitting: Family Medicine

## 2019-05-03 ENCOUNTER — Other Ambulatory Visit: Payer: Self-pay

## 2019-05-03 DIAGNOSIS — M25551 Pain in right hip: Secondary | ICD-10-CM

## 2019-05-12 ENCOUNTER — Encounter: Payer: Self-pay | Admitting: Gastroenterology

## 2019-05-17 ENCOUNTER — Ambulatory Visit (INDEPENDENT_AMBULATORY_CARE_PROVIDER_SITE_OTHER)
Admission: RE | Admit: 2019-05-17 | Discharge: 2019-05-17 | Disposition: A | Discharge status: Home / Self Care | Payer: Medicare Other | Source: Ambulatory Visit | Attending: Family Medicine | Admitting: Family Medicine

## 2019-05-17 ENCOUNTER — Other Ambulatory Visit: Payer: Self-pay

## 2019-05-17 DIAGNOSIS — R918 Other nonspecific abnormal finding of lung field: Secondary | ICD-10-CM

## 2019-05-26 ENCOUNTER — Ambulatory Visit (INDEPENDENT_AMBULATORY_CARE_PROVIDER_SITE_OTHER): Payer: Medicare Other | Admitting: Physical Medicine and Rehabilitation

## 2019-05-26 ENCOUNTER — Encounter: Payer: Self-pay | Admitting: Physical Medicine and Rehabilitation

## 2019-05-26 ENCOUNTER — Ambulatory Visit: Payer: Self-pay

## 2019-05-26 DIAGNOSIS — M25551 Pain in right hip: Secondary | ICD-10-CM | POA: Diagnosis not present

## 2019-05-26 DIAGNOSIS — M5431 Sciatica, right side: Secondary | ICD-10-CM | POA: Diagnosis not present

## 2019-05-26 DIAGNOSIS — M79651 Pain in right thigh: Secondary | ICD-10-CM

## 2019-05-26 MED ORDER — TRIAMCINOLONE ACETONIDE 40 MG/ML IJ SUSP
60.0000 mg | INTRAMUSCULAR | Status: AC | PRN
  Administered 2019-05-26: 60 mg via INTRA_ARTICULAR

## 2019-05-26 MED ORDER — BUPIVACAINE HCL 0.25 % IJ SOLN
4.0000 mL | INTRAMUSCULAR | Status: AC | PRN
  Administered 2019-05-26: 4 mL via INTRA_ARTICULAR

## 2019-05-26 NOTE — Progress Notes (Signed)
Renee Green - 85 y.o. female MRN [id number]  Date of birth: 01-18-1933  Office Visit Note: Visit Date: 05/26/2019 PCP: Briscoe Deutscher, DO Referred by: Briscoe Deutscher, DO  Subjective: Chief Complaint  Patient presents with   Right Hip - Pain   Right Leg - Pain   HPI: Renee Green is a 85 y.o. female who comes in today At the request of Dr. Briscoe Deutscher for evaluation management of right hip pain.  She reports having a lot of right hip pain mostly posterior buttock pain which began sometime around June of this year without specific injury.  She likes to walk a lot for exercise and peace of mind and really has been having a hard time walking as she been getting this really posterior hip pain.  She has a lot of difficulty with driving as well when she tries to press the accelerator with her right foot she gets pain into the hip and groin region.  Some pain getting in and out of the car.  She is having somewhat of a limp when she walks.  She reports that walking in general though does seem to help her some.  She at first felt like she was having some sciatica type pain that she has had off and on.  She has had no history of lumbar surgery.  She has had bilateral knee replacements.  She is not noted any focal weakness or bowel or bladder changes or paresthesias or numbness or tingling.  She reports a history of moving here from [place] at her husband's request to be closer to their daughter.  Unfortunately in March of this year the husband did pass away as he was on dialysis.  She reports between having the move from [place] and then his passing and then now the hip problem along with the coronavirus is really been a tough several months.  She has tried some medications without really any relief.  Plain film x-ray of the pelvis and right hip show moderate degenerative change of the right hip more than the left hip.  No fractures.  She also has lumbar spine x-ray showing degenerative changes with  spondylolisthesis.  There was actually a CT scan of the abdomen pelvis performed several months ago and I did briefly review that I did not think there was much in the way of stenosis of the lumbar spine although there was a central disc osteophyte complex at L5-S1.  Prior knee surgeries were performed in [place] at [place] by Dr. Aline August.  Review of Systems  Constitutional: Negative for chills, fever, malaise/fatigue and weight loss.  HENT: Negative for hearing loss and sinus pain.   Eyes: Negative for blurred vision, double vision and photophobia.  Respiratory: Negative for cough and shortness of breath.   Cardiovascular: Negative for chest pain, palpitations and leg swelling.  Gastrointestinal: Negative for abdominal pain, nausea and vomiting.  Genitourinary: Negative for flank pain.  Musculoskeletal: Positive for back pain and joint pain. Negative for myalgias.  Skin: Negative for itching and rash.  Neurological: Negative for tremors, focal weakness and weakness.  Endo/Heme/Allergies: Negative.   Psychiatric/Behavioral: Negative for depression.  All other systems reviewed and are negative.  Otherwise per HPI.  Assessment & Plan: Visit Diagnoses:  1. Right hip pain   2. Right thigh pain   3. Sciatica, right side     Plan: Findings:  Chronic worsening severe at times right posterior buttock pain with occasional referral to the anterior groin and anterior thigh down  to the knee.  She reports it makes her knee actually feel funny when you talk to her and the knee replacement on that side was sort of the worst of the 2 but has felt stable.  No radicular pattern per se although posterior buttock pain.  X-rays do show degenerative hip changes really more than the lumbar spine although we do not have an MRI of the lumbar spine.  I think this probably is a hip issue.  She does have pain with internal and external rotation that does refer to the groin and somewhat anterior lateral aspect of  the hip.  I doubt she has sacroiliac joint manifested type pain although it is over the buttock region.  Today we decided to complete diagnostic and hopefully therapeutic intra-articular hip injection.  She actually did get really good relief during the anesthetic phase even of the posterior hip pain and buttock pain.  Depending on results with the cortisone portion she can call us and we can try to help with appropriate plans.  Obviously if it is an intermittent injection from time to time we could do that for her benefit.  If it would like it was heading towards more of a replacement she is stating to me that she probably rather see the physician that there needs Dr. Darcus Austin.  Obviously Dr. Ninfa Linden office would be happy to see her as well.    Meds & Orders: No orders of the defined types were placed in this encounter.   Orders Placed This Encounter  Procedures   Large Joint Inj: R hip joint   XR C-ARM NO REPORT    Follow-up: Return if symptoms worsen or fail to improve.   Procedures: Large Joint Inj: R hip joint on 05/26/2019 11:37 AM Indications: diagnostic evaluation and pain Details: 22 G 3.5 in needle, fluoroscopy-guided anterior approach  Arthrogram: No  Medications: 4 mL bupivacaine 0.25 %; 60 mg triamcinolone acetonide 40 MG/ML Outcome: tolerated well, no immediate complications  There was excellent flow of contrast producing a partial arthrogram of the hip. The patient did have relief of symptoms during the anesthetic phase of the injection. Procedure, treatment alternatives, risks and benefits explained, specific risks discussed. Consent was given by the patient. Immediately prior to procedure a time out was called to verify the correct patient, procedure, equipment, support staff and site/side marked as required. Patient was prepped and draped in the usual sterile fashion.      No notes on file   Clinical History: EXAM: DG HIP (WITH OR WITHOUT PELVIS) 2-3V  RIGHT  COMPARISON:  None.  FINDINGS: Mild joint space narrowing right hip. Negative for fracture or AVN. No focal bony lesion. Minimal spurring  Calcified uterine fibroid.  Mild degenerative change left hip  IMPRESSION: Mild to moderate degenerative change right hip without acute abnormality.   Electronically Signed   By: Franchot Gallo M.D.   On: 04/28/2019 14:51   She reports that she has never smoked. She has never used smokeless tobacco.  Recent Labs    07/15/18  HGBA1C 5.5    Objective:  VS:  HT:     WT:    BMI:      BP:    HR: bpm   TEMP: ( )   RESP:  Physical Exam Vitals signs and nursing note reviewed.  Constitutional:      General: She is not in acute distress.    Appearance: Normal appearance. She is well-developed.  HENT:     Head:  Normocephalic and atraumatic.     Nose: Nose normal.     Mouth/Throat:     Mouth: Mucous membranes are moist.     Pharynx: Oropharynx is clear.  Eyes:     Conjunctiva/sclera: Conjunctivae normal.     Pupils: Pupils are equal, round, and reactive to light.  Neck:     Musculoskeletal: Normal range of motion and neck supple.  Cardiovascular:     Rate and Rhythm: Regular rhythm.  Pulmonary:     Effort: Pulmonary effort is normal. No respiratory distress.  Abdominal:     General: There is no distension.     Palpations: Abdomen is soft.     Tenderness: There is no guarding.  Musculoskeletal:     Right lower leg: No edema.     Left lower leg: No edema.     Comments: Patient ambulates without aid with an antalgic gait to the right.  She does not have a Trendelenburg gait.  She has some pain going from sit to stand in full extension of the lumbar spine with pain with facet loading but not concordant for her buttock pain.  She does have pain with right hip rotation actually more external than internal.  No pain on the left.  No pain over the greater trochanters.  She has good distal strength.  Skin:    General: Skin is warm  and dry.     Findings: No erythema or rash.  Neurological:     General: No focal deficit present.     Mental Status: She is alert and oriented to person, place, and time.     Motor: No abnormal muscle tone.     Coordination: Coordination normal.     Gait: Gait normal.  Psychiatric:        Mood and Affect: Mood normal.        Behavior: Behavior normal.        Thought Content: Thought content normal.     Ortho Exam Imaging: Xr C-arm No Report  Result Date: 05/26/2019 Please see Notes tab for imaging impression.   Past Medical/Family/Surgical/Social History: Medications & Allergies reviewed per EMR, new medications updated. Patient Active Problem List   Diagnosis Date Noted   Lung nodules, stable, next CT due 05/2020 04/27/2019   Right sided sciatica 04/27/2019   Right hip pain 04/27/2019   Diverticulosis 01/19/2019   B12 deficiency 01/19/2019   Grief 01/19/2019   Asthma 01/18/2019   GERD (gastroesophageal reflux disease) 01/18/2019   History of cardiac catheterization 01/18/2019   Essential hypertension 06/25/2015   Mild intermittent asthma 06/25/2015   Primary osteoarthritis of left knee 06/18/2015   Myopia of both eyes 04/14/2012   Past Medical History:  Diagnosis Date   Arthritis    Asthma    Chicken pox    Colon polyp    Diverticulitis    Gallstones    GERD (gastroesophageal reflux disease)    High cholesterol    History of Helicobacter pylori infection 09/15/1998   Hypertension    Knee joint replaced by other means 06/26/2015   UTI (urinary tract infection)    Family History  Problem Relation Age of Onset   Hypertension Mother    Breast cancer Mother    Colon cancer Mother    Lung cancer Father    Prostate cancer Brother    Hypertension Brother    Early death Maternal Grandfather    Early death Brother    Past Surgical History:  Procedure Laterality Date  APPENDECTOMY  2001   [facility]   COLONOSCOPY  08/29/2014   ECTOPIC PREGNANCY SURGERY  1973   lost right tube    ESOPHAGOGASTRODUODENOSCOPY  2000   REPLACEMENT TOTAL KNEE Bilateral 2016 and 2017   Social History   Occupational History   Occupation: retired  Tobacco Use   Smoking status: Never Smoker   Smokeless tobacco: Never Used  Substance and Sexual Activity   Alcohol use: Never    Frequency: Never   Drug use: Never   Sexual activity: Not on file

## 2019-05-26 NOTE — Progress Notes (Signed)
 .  Numeric Pain Rating Scale and Functional Assessment Average Pain 5   In the last MONTH (on 0-10 scale) has pain interfered with the following?  1. General activity like being  able to carry out your everyday physical activities such as walking, climbing stairs, carrying groceries, or moving a chair?  Rating(5)  -Dye Allergies.  

## 2019-05-30 ENCOUNTER — Other Ambulatory Visit: Payer: Self-pay | Admitting: Family Medicine

## 2019-05-30 DIAGNOSIS — M2550 Pain in unspecified joint: Secondary | ICD-10-CM

## 2019-06-02 ENCOUNTER — Other Ambulatory Visit: Payer: Self-pay

## 2019-06-02 ENCOUNTER — Ambulatory Visit (AMBULATORY_SURGERY_CENTER): Payer: Self-pay | Admitting: *Deleted

## 2019-06-02 ENCOUNTER — Encounter: Payer: Self-pay | Admitting: Gastroenterology

## 2019-06-02 VITALS — Temp 98.0°F | Ht 65.0 in | Wt 183.0 lb

## 2019-06-02 DIAGNOSIS — K529 Noninfective gastroenteritis and colitis, unspecified: Secondary | ICD-10-CM

## 2019-06-02 NOTE — Progress Notes (Signed)
No constipation issues per pt  No egg or soy allergy  No home oxygen use or problems with anesthesia  No medications for weight loss taken  emmi information given  Pt is aware that care partner will wait in the car during procedure; if they feel like they will be too hot to wait in the car; they may wait in the lobby.  We want them to wear a mask (we do not have any that we can provide them), practice social distancing, and we will check their temperatures when they get here.  I did remind patient that their care partner needs to stay in the parking lot the entire time. Pt will wear mask into building.   Pt states that she has fainted with her prep twice.  She didn't have any issues the last time.  She states she usually only drinks water with her prep.  I told her to push her fluids- one 8-10 ounces of clear liquids every hour.  I told her to also drink some fluids with sugar and drink strained chicken noodle soup (broth only) for protein. Understanding voiced

## 2019-06-16 ENCOUNTER — Other Ambulatory Visit: Payer: Self-pay | Admitting: Gastroenterology

## 2019-06-16 ENCOUNTER — Ambulatory Visit (AMBULATORY_SURGERY_CENTER): Payer: Medicare Other | Admitting: Gastroenterology

## 2019-06-16 ENCOUNTER — Encounter: Payer: Self-pay | Admitting: Gastroenterology

## 2019-06-16 ENCOUNTER — Other Ambulatory Visit: Payer: Self-pay

## 2019-06-16 VITALS — BP 153/66 | HR 67 | Temp 97.8°F | Resp 18 | Ht 65.0 in | Wt 183.0 lb

## 2019-06-16 DIAGNOSIS — K5730 Diverticulosis of large intestine without perforation or abscess without bleeding: Secondary | ICD-10-CM | POA: Diagnosis not present

## 2019-06-16 DIAGNOSIS — K529 Noninfective gastroenteritis and colitis, unspecified: Secondary | ICD-10-CM

## 2019-06-16 MED ORDER — SODIUM CHLORIDE 0.9 % IV SOLN: 500.0000 mL | Freq: Once | INTRAVENOUS | Status: DC

## 2019-06-16 NOTE — Op Note (Signed)
[facility] Patient Name: Renee Green Procedure Date: 06/16/2019 7:18 AM MRN: [id number] Endoscopist: [place]. Loletha Carrow , MD Age: 85 Referring MD:  Date of Birth: 1933/06/17 Gender: Female Account #: [id number] Procedure:                Colonoscopy Indications:              Chronic diarrhea Medicines:                Monitored Anesthesia Care Procedure:                Pre-Anesthesia Assessment:                           - Prior to the procedure, a History and Physical                            was performed, and patient medications and                            allergies were reviewed. The patient's tolerance of                            previous anesthesia was also reviewed. The risks                            and benefits of the procedure and the sedation                            options and risks were discussed with the patient.                            All questions were answered, and informed consent                            was obtained. Prior Anticoagulants: The patient has                            taken no previous anticoagulant or antiplatelet                            agents except for aspirin. ASA Grade Assessment: II                            - A patient with mild systemic disease. After                            reviewing the risks and benefits, the patient was                            deemed in satisfactory condition to undergo the                            procedure.  After obtaining informed consent, the colonoscope                            was passed under direct vision. Throughout the                            procedure, the patient's blood pressure, pulse, and                            oxygen saturations were monitored continuously. The                            Colonoscope was introduced through the anus and                            advanced to the the terminal ileum, with   identification of the appendiceal orifice and IC                            valve. The colonoscopy was performed without                            difficulty. The patient tolerated the procedure                            well. The quality of the bowel preparation was                            fair. The terminal ileum, ileocecal valve,                            appendiceal orifice, and rectum were photographed. Scope In: 8:18:13 AM Scope Out: 8:32:36 AM Scope Withdrawal Time: 0 hours 7 minutes 33 seconds  Total Procedure Duration: 0 hours 14 minutes 23 seconds  Findings:                 The digital rectal exam findings include decreased                            sphincter tone.                           The terminal ileum appeared normal.                           Multiple diverticula were found from transverse                            colon to sigmoid colon.                           Normal mucosa was found in the entire colon.                            Biopsies for histology were taken with a cold  forceps from the right colon and left colon for                            evaluation of microscopic colitis.                           The exam was otherwise without abnormality on                            direct and retroflexion views. Complications:            No immediate complications. Estimated Blood Loss:     Estimated blood loss was minimal. Impression:               - Preparation of the colon was fair.                           - Decreased sphincter tone found on digital rectal                            exam.                           - Diverticulosis from transverse colon to sigmoid                            colon.                           - Normal mucosa in the entire examined colon.                            Biopsied.                           - The examination was otherwise normal on direct                            and retroflexion  views. Recommendation:           - Patient has a contact number available for                            emergencies. The signs and symptoms of potential                            delayed complications were discussed with the                            patient. Return to normal activities tomorrow.                            Written discharge instructions were provided to the                            patient.                           -  Resume previous diet.                           - Continue present medications.                           - Await pathology results.                           - No future routine screening colonoscopy needed                            due to age. Henry L. Loletha Carrow, MD 06/16/2019 8:38:08 AM This report has been signed electronically.

## 2019-06-16 NOTE — Progress Notes (Signed)
A and O x3. Report to RN. Tolerated MAC anesthesia well.

## 2019-06-16 NOTE — Progress Notes (Signed)
Called to room to assist during endoscopic procedure.  Patient ID and intended procedure confirmed with present staff. Received instructions for my participation in the procedure from the performing physician.  

## 2019-06-16 NOTE — Patient Instructions (Signed)
Please read handouts provided. Continue present medications. Await pathology results.        YOU HAD AN ENDOSCOPIC PROCEDURE TODAY AT THE [facility]:   Refer to the procedure report that was given to you for any specific questions about what was found during the examination.  If the procedure report does not answer your questions, please call your gastroenterologist to clarify.  If you requested that your care partner not be given the details of your procedure findings, then the procedure report has been included in a sealed envelope for you to review at your convenience later.  YOU SHOULD EXPECT: Some feelings of bloating in the abdomen. Passage of more gas than usual.  Walking can help get rid of the air that was put into your GI tract during the procedure and reduce the bloating. If you had a lower endoscopy (such as a colonoscopy or flexible sigmoidoscopy) you may notice spotting of blood in your stool or on the toilet paper. If you underwent a bowel prep for your procedure, you may not have a normal bowel movement for a few days.  Please Note:  You might notice some irritation and congestion in your nose or some drainage.  This is from the oxygen used during your procedure.  There is no need for concern and it should clear up in a day or so.  SYMPTOMS TO REPORT IMMEDIATELY:   Following lower endoscopy (colonoscopy or flexible sigmoidoscopy):  Excessive amounts of blood in the stool  Significant tenderness or worsening of abdominal pains  Swelling of the abdomen that is new, acute  Fever of 100F or higher    For urgent or emergent issues, a gastroenterologist can be reached at any hour by calling [phone].   DIET:  We do recommend a small meal at first, but then you may proceed to your regular diet.  Drink plenty of fluids but you should avoid alcoholic beverages for 24 hours.  ACTIVITY:  You should plan to take it easy for the rest of today and you should NOT  DRIVE or use heavy machinery until tomorrow (because of the sedation medicines used during the test).    FOLLOW UP: Our staff will call the number listed on your records 48-72 hours following your procedure to check on you and address any questions or concerns that you may have regarding the information given to you following your procedure. If we do not reach you, we will leave a message.  We will attempt to reach you two times.  During this call, we will ask if you have developed any symptoms of COVID 19. If you develop any symptoms (ie: fever, flu-like symptoms, shortness of breath, cough etc.) before then, please call [phone].  If you test positive for Covid 19 in the 2 weeks post procedure, please call and report this information to us.    If any biopsies were taken you will be contacted by phone or by letter within the next 1-3 weeks.  Please call us at [phone] if you have not heard about the biopsies in 3 weeks.    SIGNATURES/CONFIDENTIALITY: You and/or your care partner have signed paperwork which will be entered into your electronic medical record.  These signatures attest to the fact that that the information above on your After Visit Summary has been reviewed and is understood.  Full responsibility of the confidentiality of this discharge information lies with you and/or your care-partner. 

## 2019-06-20 ENCOUNTER — Telehealth: Payer: Self-pay

## 2019-06-20 NOTE — Telephone Encounter (Signed)
  Follow up Call-  Call back number 06/16/2019  Post procedure Call Back phone  # [phone]  Permission to leave phone message Yes     Patient questions:  Do you have a fever, pain , or abdominal swelling? No. Pain Score  0 *  Have you tolerated food without any problems? Yes.    Have you been able to return to your normal activities? Yes.    Do you have any questions about your discharge instructions: Diet   No. Medications  No. Follow up visit  No.  Do you have questions or concerns about your Care? No.  Actions: * If pain score is 4 or above: No action needed, pain <4. 1. Have you developed a fever since your procedure? no  2.   Have you had an respiratory symptoms (SOB or cough) since your procedure? no  3.   Have you tested positive for COVID 19 since your procedure no  4.   Have you had any family members/close contacts diagnosed with the COVID 19 since your procedure?  no   If yes to any of these questions please route to Joylene John, RN and Alphonsa Gin, Therapist, sports.

## 2019-06-28 ENCOUNTER — Other Ambulatory Visit: Payer: Self-pay

## 2019-06-28 ENCOUNTER — Ambulatory Visit
Admission: RE | Admit: 2019-06-28 | Discharge: 2019-06-28 | Disposition: A | Discharge status: Home / Self Care | Payer: Medicare Other | Source: Ambulatory Visit | Attending: Family Medicine | Admitting: Family Medicine

## 2019-06-28 DIAGNOSIS — Z1239 Encounter for other screening for malignant neoplasm of breast: Secondary | ICD-10-CM

## 2019-07-07 ENCOUNTER — Telehealth: Payer: Self-pay

## 2019-07-07 NOTE — Telephone Encounter (Signed)
Copied from [place] [id number]. Topic: General - Other >> Jul 07, 2019 12:33 PM Virl Axe D wrote: Reason for CRM: Amber with [facility] stated they received request for medical records for pt for the last two years but they do not have that information. Please advise.

## 2019-07-07 NOTE — Telephone Encounter (Signed)
We have not seen pt or received a request.. Are you aware of anything?

## 2019-07-07 NOTE — Telephone Encounter (Signed)
I sent a request for mammogram report. That is the only thing I can think of.

## 2019-08-03 ENCOUNTER — Other Ambulatory Visit: Payer: Self-pay

## 2019-08-03 ENCOUNTER — Ambulatory Visit (INDEPENDENT_AMBULATORY_CARE_PROVIDER_SITE_OTHER): Payer: Medicare Other | Admitting: Family Medicine

## 2019-08-03 ENCOUNTER — Encounter: Payer: Self-pay | Admitting: Family Medicine

## 2019-08-03 VITALS — BP 122/74 | HR 68 | Temp 98.1°F | Ht 65.0 in | Wt 184.0 lb

## 2019-08-03 DIAGNOSIS — M1611 Unilateral primary osteoarthritis, right hip: Secondary | ICD-10-CM | POA: Diagnosis not present

## 2019-08-03 DIAGNOSIS — M47816 Spondylosis without myelopathy or radiculopathy, lumbar region: Secondary | ICD-10-CM | POA: Diagnosis not present

## 2019-08-03 DIAGNOSIS — M169 Osteoarthritis of hip, unspecified: Secondary | ICD-10-CM | POA: Insufficient documentation

## 2019-08-03 DIAGNOSIS — I10 Essential (primary) hypertension: Secondary | ICD-10-CM

## 2019-08-03 DIAGNOSIS — Z96653 Presence of artificial knee joint, bilateral: Secondary | ICD-10-CM

## 2019-08-03 DIAGNOSIS — K219 Gastro-esophageal reflux disease without esophagitis: Secondary | ICD-10-CM

## 2019-08-03 DIAGNOSIS — K5790 Diverticulosis of intestine, part unspecified, without perforation or abscess without bleeding: Secondary | ICD-10-CM

## 2019-08-03 DIAGNOSIS — F4329 Adjustment disorder with other symptoms: Secondary | ICD-10-CM

## 2019-08-03 DIAGNOSIS — E2839 Other primary ovarian failure: Secondary | ICD-10-CM

## 2019-08-03 DIAGNOSIS — F4381 Prolonged grief disorder: Secondary | ICD-10-CM

## 2019-08-03 HISTORY — DX: Presence of artificial knee joint, bilateral: Z96.653

## 2019-08-03 MED ORDER — LOSARTAN POTASSIUM-HCTZ 100-12.5 MG PO TABS: 1.0000 | ORAL_TABLET | Freq: Every day | ORAL | 3 refills | Status: DC

## 2019-08-03 NOTE — Patient Instructions (Addendum)
Please return in 3 months for your annual complete physical; please come fasting.  I have stopped several of your medications. We can restart them if you find they were helpful. You may use your ventolin if you wheeze intermittently. Call me if you find you are short of breath after stopping the daily inhaler.  I have adjusted your blood pressure medications: stop the individual pill and start the combination pill daily.   You can decide to stick with the red yeast rice and fish oil or not; just let me know at your next visit when I recheck your cholesterol levels.   I have ordered a bone density. We should recheck this now. Keep taking your vit D and calcium pills.   It was a pleasure meeting you today! Thank you for choosing [place] to meet your healthcare needs! I truly look forward to working with you. If you have any questions or concerns, please send me a message via Mychart or call the office at [phone].  I hope you do well over the holidays. Call me if you need me.

## 2019-08-03 NOTE — Progress Notes (Signed)
Subjective  CC:  Chief Complaint  Patient presents with  . Transitions Of Care    HPI: Renee Green is a 85 y.o. female who presents to [facility] today to establish care with me as a new patient.  Reviewed chart in detail. Multiple medical problems discussed and meds adjusted.  She has the following concerns or needs:  HTN: on hctz 50 from prior pcps; and low dose losartan. Has been well controlled. No known CAD; has had cath in past. Takes 2 aspirin daily.   OA: multiple sites. S/o bilateral knee replacements; has bilateral hip arthritis treated by physiatrist most recently; steroid injection for right hip; helped but now left hurts. On meloxicam daily for hand OA. Has h/o sciatica.   H/o wheezing with allergies: was started on combo inhaler but not sure she needs it. Never has had asthma exacerbation or hospitalization or asthma as a young adult.   On daily PPI per PCP but says has only occasional sxs. H/o treated h.pylori. no PUD.   Bone density screen: last was 10 years ago. On ca and Vit d  Grief: widowed at beginning of pandemic after dealing with prolonged illness of husband. Coping but very difficult due to her move here from [place] and her isolation.   Assessment  1. Essential hypertension   2. Primary osteoarthritis of right hip   3. Spondylosis of lumbar region without myelopathy or radiculopathy   4. Total knee replacement status, bilateral   5. Hypoestrogenism   6. Diverticulosis   7. Gastroesophageal reflux disease without esophagitis      Plan   HTN: good control but would prefer lower dose of hctz; adjusted to hyzaar to decrease pill burden as well. Recheck 3 months.   OA: meloxicam and stretches. Pt defers further eval now.   Wheezing: allergies: suspect this is not intrinsic asthma. Stop inhaler and use prn albuterol. Will monitor sxs and adjust meds accordingly. Would favor cxr and or PFTs if needed.   Recheck  dexa  Gerd: change to prn PPI and monitor for symptom recurrence.   On fish oil and red yeast rice: will recheck lipids next visit.   Grief reaction: complicated. Pt says she is coping well. No further interventions needed at this time.  Follow up:  Return in about 3 months (around 11/03/2019) for complete physical. Orders Placed This Encounter  Procedures  . Dexa BC   Meds ordered this encounter  Medications  . losartan-hydrochlorothiazide (HYZAAR) 100-12.5 MG tablet    Sig: Take 1 tablet by mouth daily.    Dispense:  90 tablet    Refill:  3     Depression screen PHQ 2/9 10/20/2018  Decreased Interest 0  Down, Depressed, Hopeless 0  PHQ - 2 Score 0  Altered sleeping 0  Tired, decreased energy 1  Change in appetite 1  Feeling bad or failure about yourself  0  Trouble concentrating 0  Moving slowly or fidgety/restless 0  Suicidal thoughts 0  PHQ-9 Score 2    We updated and reviewed the patient's past history in detail and it is documented below.  Patient Active Problem List   Diagnosis Date Noted  . Essential hypertension 06/25/2015    Priority: High  . Degenerative joint disease (DJD) of hip 08/03/2019    Priority: Medium    Right, xrays 2020, mild   . Osteoarthritis of lumbar spine 08/03/2019    Priority: Medium    xrays 2020   .  Lung nodules, stable, next CT due 05/2020 04/27/2019    Priority: Medium  . GERD (gastroesophageal reflux disease) 01/18/2019    Priority: Medium  . Allergic asthma 06/25/2015    Priority: Medium  . Primary osteoarthritis of left knee 06/18/2015    Priority: Medium  . Total knee replacement status, bilateral 08/03/2019    Priority: Low  . Diverticulosis 01/19/2019    Priority: Low    Diffuse by colonoscopy   . B12 deficiency 01/19/2019    Priority: Low  . History of cardiac catheterization 01/18/2019   Health Maintenance  Topic Date Due  . DEXA SCAN  08/22/2000  . PNA vac Low Risk Adult (1 of 2 - PCV13) 08/22/2000  .  INFLUENZA VACCINE  Completed   Immunization History  Administered Date(s) Administered  . Influenza, High Dose Seasonal PF 07/26/2018, 06/23/2019   Current Meds  Medication Sig  . aspirin EC 81 MG tablet Take 81 mg by mouth daily.  . [place]-Glucosamine-Vit D (OSTEO BI-FLEX ONE PER DAY PO) Take 1 tablet by mouth 2 (two) times daily.  . calcium citrate-vitamin D (CITRACAL+D) 315-200 MG-UNIT tablet Take 1 tablet by mouth 2 (two) times daily.  . Cholecalciferol (VITAMIN D3) 50 MCG (2000 UT) CHEW Chew 1 tablet by mouth 2 (two) times daily.   . fluticasone (FLONASE) 50 MCG/ACT nasal spray Place 1 spray into both nostrils daily.  . meloxicam (MOBIC) 15 MG tablet TAKE 1 TABLET BY MOUTH EVERY DAY  . Multiple Vitamins-Minerals (PRESERVISION AREDS 2 PO) Take 2 capsules by mouth daily.  . Omega-3 Fatty Acids (FISH OIL) 1200 MG CPDR Take 2 tablets by mouth 2 (two) times daily.   Marland Kitchen omeprazole (PRILOSEC) 20 MG capsule Take 1 capsule (20 mg total) by mouth daily.  Vladimir Faster Glycol-Propyl Glycol (SYSTANE FREE OP) Apply to eye as needed.  . Red Yeast Rice 600 MG CAPS Take 600 mg by mouth 2 (two) times daily.   . vitamin B-12 (CYANOCOBALAMIN) 1000 MCG tablet Take 1,000 mcg by mouth daily.  . [DISCONTINUED] Fluticasone-Salmeterol (WIXELA INHUB) 100-50 MCG/DOSE AEPB INHALE 1 PUFF BY MOUTH EVERY DAY  . [DISCONTINUED] gabapentin (NEURONTIN) 100 MG capsule TAKE 1 CAPSULE (100 MG TOTAL) BY MOUTH AT BEDTIME. INCREASE AS TOLERATED UP TO THREE TIMES A DAY (Patient taking differently: Take 300 mg by mouth at bedtime. )  . [DISCONTINUED] hydrochlorothiazide (HYDRODIURIL) 50 MG tablet Take 1 tablet (50 mg total) by mouth daily.  . [DISCONTINUED] Multiple Vitamins-Minerals (PRESERVISION AREDS 2 PO) Take 1 tablet by mouth 2 (two) times daily.     Allergies: Patient is allergic to latex and penicillins. Past Medical History Patient  has a past medical history of Allergy, Arthritis, Asthma, Cancer ([place]), Chicken pox,  Colon polyp, Diverticulitis, Gallstones, GERD (gastroesophageal reflux disease), High cholesterol, History of Helicobacter pylori infection (09/15/1998), Hypertension, Knee joint replaced by other means (06/26/2015), Macular degeneration, Total knee replacement status, bilateral (08/03/2019), and UTI (urinary tract infection). Past Surgical History Patient  has a past surgical history that includes Cholecystectomy (1993); Breast biopsy (1987, 1993); Appendectomy (2001); Replacement total knee (Bilateral, 2016 and 2017); Ectopic pregnancy surgery (1973); Esophagogastroduodenoscopy (2000); Colonoscopy (08/29/2014); Cataract extraction, bilateral; and Upper gastrointestinal endoscopy. Family History: Patient family history includes Breast cancer in her mother; Colon cancer in her mother; Early death in her brother and maternal grandfather; Hypertension in her brother and mother; Lung cancer in her father; Prostate cancer in her brother. Social History:  Patient  reports that she has quit smoking. She has never used  smokeless tobacco. She reports that she does not drink alcohol or use drugs.  Review of Systems: Constitutional: negative for fever or malaise Ophthalmic: negative for photophobia, double vision or loss of vision Cardiovascular: negative for chest pain, dyspnea on exertion, or new LE swelling Respiratory: negative for SOB or persistent cough Gastrointestinal: negative for abdominal pain, change in bowel habits or melena Genitourinary: negative for dysuria or gross hematuria Musculoskeletal: negative for new gait disturbance or muscular weakness Integumentary: negative for new or persistent rashes Neurological: negative for TIA or stroke symptoms Psychiatric: negative for SI or delusions Allergic/Immunologic: negative for hives  Patient Care Team    Relationship Specialty Notifications Start End  Leamon Arnt, MD PCP - General Family Medicine  06/28/19     Objective  Vitals: BP  122/74 (BP Location: Left Arm, Patient Position: Sitting, Cuff Size: Normal)   Pulse 68   Temp 98.1 F (36.7 C) (Temporal)   Ht 5\' 5"  (1.651 m)   Wt 184 lb (83.5 kg)   SpO2 98%   BMI 30.62 kg/m  General:  Well developed, well nourished, no acute distress but tearful Psych:  Alert and oriented,normal mood and affect HEENT:  Normocephalic, atraumatic, non-icteric sclera, PERRL, supple neck  Cardiovascular:  RRR without gallop, rub or murmur Respiratory:  Good breath sounds bilaterally, CTAB with normal respiratory effort MSK: OA changes in hands Neurologic:    Mental status is normal. Gross motor and sensory exams are normal. Normal gait   Commons side effects, risks, benefits, and alternatives for medications and treatment plan prescribed today were discussed, and the patient expressed understanding of the given instructions. Patient is instructed to call or message via MyChart if he/she has any questions or concerns regarding our treatment plan. No barriers to understanding were identified. We discussed Red Flag symptoms and signs in detail. Patient expressed understanding regarding what to do in case of urgent or emergency type symptoms.   Medication list was reconciled, printed and provided to the patient in AVS. Patient instructions and summary information was reviewed with the patient as documented in the AVS. This note was prepared with assistance of Dragon voice recognition software. Occasional wrong-word or sound-a-like substitutions may have occurred due to the inherent limitations of voice recognition software

## 2019-08-16 ENCOUNTER — Other Ambulatory Visit: Payer: Self-pay

## 2019-08-16 ENCOUNTER — Ambulatory Visit
Admission: RE | Admit: 2019-08-16 | Discharge: 2019-08-16 | Disposition: A | Discharge status: Home / Self Care | Payer: Medicare Other | Source: Ambulatory Visit | Attending: Family Medicine | Admitting: Family Medicine

## 2019-08-16 DIAGNOSIS — E2839 Other primary ovarian failure: Secondary | ICD-10-CM

## 2019-08-17 NOTE — Progress Notes (Signed)
Please call patient: I have reviewed his/her lab results. Her bone density is normal! No further testing is needed.

## 2019-09-12 ENCOUNTER — Other Ambulatory Visit: Payer: Self-pay

## 2019-09-12 DIAGNOSIS — M5431 Sciatica, right side: Secondary | ICD-10-CM

## 2019-09-12 MED ORDER — GABAPENTIN 100 MG PO CAPS: 100.0000 mg | ORAL_CAPSULE | Freq: Every day | ORAL | 2 refills | Status: DC

## 2019-09-19 ENCOUNTER — Other Ambulatory Visit: Payer: Self-pay | Admitting: Family Medicine

## 2019-09-19 ENCOUNTER — Telehealth: Payer: Self-pay | Admitting: Family Medicine

## 2019-09-19 DIAGNOSIS — M2550 Pain in unspecified joint: Secondary | ICD-10-CM

## 2019-09-19 MED ORDER — MELOXICAM 15 MG PO TABS: 15.0000 mg | ORAL_TABLET | Freq: Every day | ORAL | 0 refills | Status: DC

## 2019-09-19 NOTE — Telephone Encounter (Signed)
Medication Refill - Medication: meloxicam (MOBIC) 15 MG tablet    Has the patient contacted their pharmacy? Yes.   (Agent: If no, request that the patient contact the pharmacy for the refill.) (Agent: If yes, when and what did the pharmacy advise?)  Preferred Pharmacy (with phone number or street name):  [id number] - [place], [place]  [address] [place] [address]  Phone: [phone] Fax: [phone]  Not a 24 hour pharmacy; exact hours not known.     Agent: Please be advised that RX refills may take up to 3 business days. We ask that you follow-up with your pharmacy.

## 2019-09-19 NOTE — Telephone Encounter (Signed)
I called the patient to schedule AWV with Loma Sousa, but there was no answer and no option to leave a message. If patient calls back, please schedule Medicare Wellness Visit at next available opening. Last AWV Aug 2013 V[facility] ([place]Dee)

## 2019-09-19 NOTE — Telephone Encounter (Signed)
Filled until office appt Requested Prescriptions  Pending Prescriptions Disp Refills  . meloxicam (MOBIC) 15 MG tablet 30 tablet 0    Sig: Take 1 tablet (15 mg total) by mouth daily.     Analgesics:  COX2 Inhibitors Failed - 09/19/2019 10:53 AM      Failed - HGB in normal range and within 360 days    Hemoglobin  Date Value Ref Range Status  11/24/2018 11.6 (L) 12.0 - 15.0 g/dL Final         Passed - Cr in normal range and within 360 days    Creatinine, Ser  Date Value Ref Range Status  11/24/2018 0.77 0.40 - 1.20 mg/dL Final         Passed - Patient is not pregnant      Passed - Valid encounter within last 12 months    Recent Outpatient Visits          1 month ago Essential hypertension   [place], [place]   4 months ago Essential hypertension   [place], [place]   8 months ago Essential hypertension   [place], [place]   9 months ago RUQ pain   [place] Wallace, [place], [place]   11 months ago Cough   [place], [place], DO      Future Appointments            In 1 month Jonni Sanger, Karie Fetch, MD [place], [place]

## 2019-10-09 ENCOUNTER — Other Ambulatory Visit: Payer: Self-pay | Admitting: Family Medicine

## 2019-10-10 NOTE — Telephone Encounter (Signed)
Please review

## 2019-10-15 ENCOUNTER — Other Ambulatory Visit: Payer: Self-pay | Admitting: Family Medicine

## 2019-10-15 DIAGNOSIS — M2550 Pain in unspecified joint: Secondary | ICD-10-CM

## 2019-10-20 ENCOUNTER — Telehealth: Payer: Self-pay | Admitting: Family Medicine

## 2019-10-20 ENCOUNTER — Telehealth: Payer: Self-pay

## 2019-10-20 NOTE — Telephone Encounter (Signed)
I left a message asking the patient to call and schedule Medicare AWV with Loma Sousa ([place]).  If patient calls back, please schedule Medicare Wellness Visit at next available opening. Last AWV Aug 2013 [facility] (Dee-Dee)

## 2019-10-20 NOTE — Telephone Encounter (Signed)
Patient returning call.

## 2019-10-20 NOTE — Telephone Encounter (Signed)
The call that patient received was to make app for AWV can you call to make

## 2019-10-20 NOTE — Telephone Encounter (Signed)
Called pt to schedule awv no answer and vm

## 2019-11-17 ENCOUNTER — Ambulatory Visit (INDEPENDENT_AMBULATORY_CARE_PROVIDER_SITE_OTHER): Payer: Medicare PPO | Admitting: Family Medicine

## 2019-11-17 ENCOUNTER — Ambulatory Visit: Payer: Medicare PPO

## 2019-11-17 ENCOUNTER — Encounter: Payer: Self-pay | Admitting: Family Medicine

## 2019-11-17 ENCOUNTER — Other Ambulatory Visit: Payer: Self-pay

## 2019-11-17 VITALS — BP 142/82 | HR 71 | Temp 98.1°F | Ht 65.0 in | Wt 183.6 lb

## 2019-11-17 DIAGNOSIS — I10 Essential (primary) hypertension: Secondary | ICD-10-CM | POA: Diagnosis not present

## 2019-11-17 DIAGNOSIS — K5792 Diverticulitis of intestine, part unspecified, without perforation or abscess without bleeding: Secondary | ICD-10-CM | POA: Diagnosis not present

## 2019-11-17 DIAGNOSIS — J4520 Mild intermittent asthma, uncomplicated: Secondary | ICD-10-CM

## 2019-11-17 DIAGNOSIS — M1712 Unilateral primary osteoarthritis, left knee: Secondary | ICD-10-CM | POA: Diagnosis not present

## 2019-11-17 DIAGNOSIS — E538 Deficiency of other specified B group vitamins: Secondary | ICD-10-CM

## 2019-11-17 DIAGNOSIS — K219 Gastro-esophageal reflux disease without esophagitis: Secondary | ICD-10-CM | POA: Diagnosis not present

## 2019-11-17 DIAGNOSIS — Z0000 Encounter for general adult medical examination without abnormal findings: Secondary | ICD-10-CM | POA: Diagnosis not present

## 2019-11-17 LAB — CBC WITH DIFFERENTIAL/PLATELET
Basophils Absolute: 0.1 10*3/uL (ref 0.0–0.1)
Basophils Relative: 1.2 % (ref 0.0–3.0)
Eosinophils Absolute: 0.3 10*3/uL (ref 0.0–0.7)
Eosinophils Relative: 5.1 % — ABNORMAL HIGH (ref 0.0–5.0)
HCT: 37.6 % (ref 36.0–46.0)
Hemoglobin: 12.7 g/dL (ref 12.0–15.0)
Lymphocytes Relative: 35.3 % (ref 12.0–46.0)
Lymphs Abs: 2.3 10*3/uL (ref 0.7–4.0)
MCHC: 33.8 g/dL (ref 30.0–36.0)
MCV: 92.8 fl (ref 78.0–100.0)
Monocytes Absolute: 0.5 10*3/uL (ref 0.1–1.0)
Monocytes Relative: 6.9 % (ref 3.0–12.0)
Neutro Abs: 3.4 10*3/uL (ref 1.4–7.7)
Neutrophils Relative %: 51.5 % (ref 43.0–77.0)
Platelets: 253 10*3/uL (ref 150.0–400.0)
RBC: 4.05 Mil/uL (ref 3.87–5.11)
RDW: 13 % (ref 11.5–15.5)
WBC: 6.6 10*3/uL (ref 4.0–10.5)

## 2019-11-17 LAB — POCT URINALYSIS DIPSTICK
Bilirubin, UA: NEGATIVE
Blood, UA: NEGATIVE
Glucose, UA: NEGATIVE
Ketones, UA: NEGATIVE
Nitrite, UA: NEGATIVE
Protein, UA: NEGATIVE
Spec Grav, UA: 1.025 (ref 1.010–1.025)
Urobilinogen, UA: 0.2 E.U./dL
pH, UA: 6 (ref 5.0–8.0)

## 2019-11-17 LAB — LIPID PANEL
Cholesterol: 219 mg/dL — ABNORMAL HIGH (ref 0–200)
HDL: 49.8 mg/dL (ref >39.00)
LDL Cholesterol: 147 mg/dL — ABNORMAL HIGH (ref 0–99)
NonHDL: 168.82
Total CHOL/HDL Ratio: 4
Triglycerides: 107 mg/dL (ref 0.0–149.0)
VLDL: 21.4 mg/dL (ref 0.0–40.0)

## 2019-11-17 LAB — COMPREHENSIVE METABOLIC PANEL
ALT: 25 U/L (ref 0–35)
AST: 27 U/L (ref 0–37)
Albumin: 4 g/dL (ref 3.5–5.2)
Alkaline Phosphatase: 58 U/L (ref 39–117)
BUN: 28 mg/dL — ABNORMAL HIGH (ref 6–23)
CO2: 32 mEq/L (ref 19–32)
Calcium: 10.8 mg/dL — ABNORMAL HIGH (ref 8.4–10.5)
Chloride: 103 mEq/L (ref 96–112)
Creatinine, Ser: 0.91 mg/dL (ref 0.40–1.20)
GFR: 58.86 mL/min — ABNORMAL LOW (ref >60.00)
Glucose, Bld: 97 mg/dL (ref 70–99)
Potassium: 4.6 mEq/L (ref 3.5–5.1)
Sodium: 142 mEq/L (ref 135–145)
Total Bilirubin: 0.8 mg/dL (ref 0.2–1.2)
Total Protein: 6.6 g/dL (ref 6.0–8.3)

## 2019-11-17 LAB — VITAMIN B12: Vitamin B-12: >1500 pg/mL — ABNORMAL HIGH (ref 211–911)

## 2019-11-17 LAB — TSH: TSH: 3.74 u[IU]/mL (ref 0.35–4.50)

## 2019-11-17 MED ORDER — FLUCONAZOLE 150 MG PO TABS: ORAL_TABLET | ORAL | 0 refills | Status: DC

## 2019-11-17 MED ORDER — LOSARTAN POTASSIUM-HCTZ 100-25 MG PO TABS: 1.0000 | ORAL_TABLET | Freq: Every day | ORAL | 3 refills | Status: DC

## 2019-11-17 MED ORDER — CIPROFLOXACIN HCL 500 MG PO TABS: 500.0000 mg | ORAL_TABLET | Freq: Two times a day (BID) | ORAL | 0 refills | Status: AC

## 2019-11-17 MED ORDER — METRONIDAZOLE 500 MG PO TABS: 500.0000 mg | ORAL_TABLET | Freq: Two times a day (BID) | ORAL | 0 refills | Status: AC

## 2019-11-17 MED ORDER — FLUTICASONE PROPIONATE 50 MCG/ACT NA SUSP: 1.0000 | Freq: Every day | NASAL | 11 refills | Status: AC

## 2019-11-17 NOTE — Patient Instructions (Addendum)
Please return in 6 months for hypertension follow up. Medicare recommends an Annual Wellness Visit for all patients. Please schedule this to be done with our Nurse Educator, Loma Sousa. This is an informative "talk" visit; it's goals are to ensure that your health care needs are being met and to give you education regarding avoiding falls, ensuring you are not suffering from depression or problems with memory or thinking, and to educate you on Advance Care Planning. It helps me take good care of you!  I will release your lab results to you on your MyChart account with further instructions. Please reply with any questions.   Please take the antibiotics as prescribed and call me if your symptoms do not improve. I will also check your urine today.   If you have any questions or concerns, please don't hesitate to send me a message via MyChart or call the office at [phone]. Thank you for visiting with [place] today! It's our pleasure caring for you.  I have adjusted your blood pressure medication again. It will still be hyzaar but the dose is adjusted. Take one pill daily.    Diverticulitis  Diverticulitis is when small pockets in your large intestine (colon) get infected or swollen. This causes stomach pain and watery poop (diarrhea). These pouches are called diverticula. They form in people who have a condition called diverticulosis. Follow these instructions at home: Medicines  Take over-the-counter and prescription medicines only as told by your doctor. These include: ? Antibiotics. ? Pain medicines. ? Fiber pills. ? Probiotics. ? Stool softeners.  Do not drive or use heavy machinery while taking prescription pain medicine.  If you were prescribed an antibiotic, take it as told. Do not stop taking it even if you feel better. General instructions   Follow a diet as told by your doctor.  When you feel better, your doctor may tell you to change your diet. You may need to eat a lot of  fiber. Fiber makes it easier to poop (have bowel movements). Healthy foods with fiber include: ? Berries. ? Beans. ? Lentils. ? Green vegetables.  Exercise 3 or more times a week. Aim for 30 minutes each time. Exercise enough to sweat and make your heart beat faster.  Keep all follow-up visits as told. This is important. You may need to have an exam of the large intestine. This is called a colonoscopy. Contact a doctor if:  Your pain does not get better.  You have a hard time eating or drinking.  You are not pooping like normal. Get help right away if:  Your pain gets worse.  Your problems do not get better.  Your problems get worse very fast.  You have a fever.  You throw up (vomit) more than one time.  You have poop that is: ? Bloody. ? Black. ? Tarry. Summary  Diverticulitis is when small pockets in your large intestine (colon) get infected or swollen.  Take medicines only as told by your doctor.  Follow a diet as told by your doctor. This information is not intended to replace advice given to you by your health care provider. Make sure you discuss any questions you have with your health care provider. Document Revised: 08/14/2017 Document Reviewed: 09/18/2016 Elsevier Patient Education  [employer].  Hypertension, Adult High blood pressure (hypertension) is when the force of blood pumping through the arteries is too strong. The arteries are the blood vessels that carry blood from the heart throughout the body. Hypertension forces the  heart to work harder to pump blood and may cause arteries to become narrow or stiff. Untreated or uncontrolled hypertension can cause a heart attack, heart failure, a stroke, kidney disease, and other problems. A blood pressure reading consists of a higher number over a lower number. Ideally, your blood pressure should be below 120/80. The first ("top") number is called the systolic pressure. It is a measure of the pressure in your  arteries as your heart beats. The second ("bottom") number is called the diastolic pressure. It is a measure of the pressure in your arteries as the heart relaxes. What are the causes? The exact cause of this condition is not known. There are some conditions that result in or are related to high blood pressure. What increases the risk? Some risk factors for high blood pressure are under your control. The following factors may make you more likely to develop this condition:  Smoking.  Having type 2 diabetes mellitus, high cholesterol, or both.  Not getting enough exercise or physical activity.  Being overweight.  Having too much fat, sugar, calories, or salt (sodium) in your diet.  Drinking too much alcohol. Some risk factors for high blood pressure may be difficult or impossible to change. Some of these factors include:  Having chronic kidney disease.  Having a family history of high blood pressure.  Age. Risk increases with age.  Race. You may be at higher risk if you are African American.  Gender. Men are at higher risk than women before age 35. After age 77, women are at higher risk than men.  Having obstructive sleep apnea.  Stress. What are the signs or symptoms? High blood pressure may not cause symptoms. Very high blood pressure (hypertensive crisis) may cause:  Headache.  Anxiety.  Shortness of breath.  Nosebleed.  Nausea and vomiting.  Vision changes.  Severe chest pain.  Seizures. How is this diagnosed? This condition is diagnosed by measuring your blood pressure while you are seated, with your arm resting on a flat surface, your legs uncrossed, and your feet flat on the floor. The cuff of the blood pressure monitor will be placed directly against the skin of your upper arm at the level of your heart. It should be measured at least twice using the same arm. Certain conditions can cause a difference in blood pressure between your right and left  arms. Certain factors can cause blood pressure readings to be lower or higher than normal for a short period of time:  When your blood pressure is higher when you are in a health care provider's office than when you are at home, this is called white coat hypertension. Most people with this condition do not need medicines.  When your blood pressure is higher at home than when you are in a health care provider's office, this is called masked hypertension. Most people with this condition may need medicines to control blood pressure. If you have a high blood pressure reading during one visit or you have normal blood pressure with other risk factors, you may be asked to:  Return on a different day to have your blood pressure checked again.  Monitor your blood pressure at home for 1 week or longer. If you are diagnosed with hypertension, you may have other blood or imaging tests to help your health care provider understand your overall risk for other conditions. How is this treated? This condition is treated by making healthy lifestyle changes, such as eating healthy foods, exercising more, and  reducing your alcohol intake. Your health care provider may prescribe medicine if lifestyle changes are not enough to get your blood pressure under control, and if:  Your systolic blood pressure is above 130.  Your diastolic blood pressure is above 80. Your personal target blood pressure may vary depending on your medical conditions, your age, and other factors. Follow these instructions at home: Eating and drinking   Eat a diet that is high in fiber and potassium, and low in sodium, added sugar, and fat. An example eating plan is called the DASH (Dietary Approaches to Stop Hypertension) diet. To eat this way: ? Eat plenty of fresh fruits and vegetables. Try to fill one half of your plate at each meal with fruits and vegetables. ? Eat whole grains, such as whole-wheat pasta, brown rice, or whole-grain  bread. Fill about one fourth of your plate with whole grains. ? Eat or drink low-fat dairy products, such as skim milk or low-fat yogurt. ? Avoid fatty cuts of meat, processed or cured meats, and poultry with skin. Fill about one fourth of your plate with lean proteins, such as fish, chicken without skin, beans, eggs, or tofu. ? Avoid pre-made and processed foods. These tend to be higher in sodium, added sugar, and fat.  Reduce your daily sodium intake. Most people with hypertension should eat less than 1,500 mg of sodium a day.  Do not drink alcohol if: ? Your health care provider tells you not to drink. ? You are pregnant, may be pregnant, or are planning to become pregnant.  If you drink alcohol: ? Limit how much you use to:  0-1 drink a day for women.  0-2 drinks a day for men. ? Be aware of how much alcohol is in your drink. In the [place] one drink equals one 12 oz bottle of beer (355 mL), one 5 oz glass of wine (148 mL), or one 1 oz glass of hard liquor (44 mL). Lifestyle   Work with your health care provider to maintain a healthy body weight or to lose weight. Ask what an ideal weight is for you.  Get at least 30 minutes of exercise most days of the week. Activities may include walking, swimming, or biking.  Include exercise to strengthen your muscles (resistance exercise), such as Pilates or lifting weights, as part of your weekly exercise routine. Try to do these types of exercises for 30 minutes at least 3 days a week.  Do not use any products that contain nicotine or tobacco, such as cigarettes, e-cigarettes, and chewing tobacco. If you need help quitting, ask your health care provider.  Monitor your blood pressure at home as told by your health care provider.  Keep all follow-up visits as told by your health care provider. This is important. Medicines  Take over-the-counter and prescription medicines only as told by your health care provider. Follow directions carefully.  Blood pressure medicines must be taken as prescribed.  Do not skip doses of blood pressure medicine. Doing this puts you at risk for problems and can make the medicine less effective.  Ask your health care provider about side effects or reactions to medicines that you should watch for. Contact a health care provider if you:  Think you are having a reaction to a medicine you are taking.  Have headaches that keep coming back (recurring).  Feel dizzy.  Have swelling in your ankles.  Have trouble with your vision. Get help right away if you:  Develop a severe headache  or confusion.  Have unusual weakness or numbness.  Feel faint.  Have severe pain in your chest or abdomen.  Vomit repeatedly.  Have trouble breathing. Summary  Hypertension is when the force of blood pumping through your arteries is too strong. If this condition is not controlled, it may put you at risk for serious complications.  Your personal target blood pressure may vary depending on your medical conditions, your age, and other factors. For most people, a normal blood pressure is less than 120/80.  Hypertension is treated with lifestyle changes, medicines, or a combination of both. Lifestyle changes include losing weight, eating a healthy, low-sodium diet, exercising more, and limiting alcohol. This information is not intended to replace advice given to you by your health care provider. Make sure you discuss any questions you have with your health care provider. Document Revised: 05/12/2018 Document Reviewed: 05/12/2018 Elsevier Patient Education  2020 [employer].

## 2019-11-17 NOTE — Progress Notes (Signed)
Subjective  Chief Complaint  Patient presents with  . Annual Exam    fasting. having pain on the left lower side about a week. suspects a uti  . Hypertension    bp is occasionally >140/90 at home. No HA, dizziness, or visual chanes  . Gastroesophageal Reflux    takes omeprazole. help with symptom    HPI: Renee Green is a 86 y.o. female who presents to [facility] today for a Female Wellness Visit. She also has the concerns and/or needs as listed above in the chief complaint. These will be addressed in addition to the Health Maintenance Visit.   Wellness Visit: annual visit with health maintenance review and exam without Pap   HM: 86 yo independent widowed female; doing well. No memory concerns or recent falls. Had bone density and it was NORMAL!! No need to repeat.   Reports had both prevnar and pneumovax at last pcp office years ago.   Due AWV  Chronic disease f/u and/or acute problem visit: (deemed necessary to be done in addition to the wellness visit):  HTN: we adjusted her meds at last visit: noting fluid retention (changed from hctz 50 to 12.5) and bp is near 140s/80s consistently. No CAD or cp. No sob.   Allergic bronchospasm: stopped inhaler and feels fine. No need for rescue inhaler.  Leg pain: stopped gabapentin. Doing fine. Has varicose veins that are at times bothersome  gerd is controlled by diet. occ PPI use   b12 deficiency on supplements. Due for recheck. Energy level is good. No h/o anemia  C/o 2 days of LLQ abdominal pain. Also notes urinary frequency w/o dysuria or gross hematuria. No n/v but has had diarrhea on and off x about a week; started after eating a large bowl of popcorn. Had tics on last colonoscopy. No blood in the stool or mucous. No severe or postprandial pain. Pain is mild but persistent.     Assessment  1. Annual physical exam   2. Essential hypertension   3. Gastroesophageal reflux disease without  esophagitis   4. Mild intermittent extrinsic asthma without complication   5. Primary osteoarthritis of left knee   6. B12 deficiency   7. Acute diverticulitis      Plan  Female Wellness Visit:  Age appropriate Health Maintenance and Prevention measures were discussed with patient. Included topics are cancer screening recommendations, ways to keep healthy (see AVS) including dietary and exercise recommendations, regular eye and dental care, use of seat belts, and avoidance of moderate alcohol use and tobacco use.   BMI: discussed patient's BMI and encouraged positive lifestyle modifications to help get to or maintain a target BMI.  HM needs and immunizations were addressed and ordered. See below for orders. See HM and immunization section for updates. Utd. Had covid vaccinations  Routine labs and screening tests ordered including cmp, cbc and lipids where appropriate.  Discussed recommendations regarding Vit D and calcium supplementation (see AVS)  Chronic disease management visit and/or acute problem visit:  HTN: fair control. Will adjust diuretic component of arb/hctz and monitor. Check renal function and electrolytes.  GERD is controlled.  Allergic asthma. Prn albuterol  b12 defic due for recheck. No sxs  OA: stable  Suspect acute diverticulitis: treat with abx and monitor sxs. Red flag sxs discussed in detail. To return if not improving. Diflucan to use if needed for secondary yeast vaginitis. Keep hydrated. Tylenol for pain.   Follow up: No follow-ups on file.  Orders Placed This Encounter  Procedures  . Urine Culture  . CBC with Differential/Platelet  . Comprehensive metabolic panel  . Lipid panel  . TSH  . Vitamin B12  . POCT urinalysis dipstick   Meds ordered this encounter  Medications  . fluticasone (FLONASE) 50 MCG/ACT nasal spray    Sig: Place 1 spray into both nostrils daily.    Dispense:  16 g    Refill:  11  . losartan-hydrochlorothiazide (HYZAAR)  100-25 MG tablet    Sig: Take 1 tablet by mouth daily.    Dispense:  90 tablet    Refill:  3  . metroNIDAZOLE (FLAGYL) 500 MG tablet    Sig: Take 1 tablet (500 mg total) by mouth 2 (two) times daily for 7 days.    Dispense:  14 tablet    Refill:  0  . ciprofloxacin (CIPRO) 500 MG tablet    Sig: Take 1 tablet (500 mg total) by mouth 2 (two) times daily for 7 days.    Dispense:  14 tablet    Refill:  0  . fluconazole (DIFLUCAN) 150 MG tablet    Sig: Take one tablet in 3 days; may repeat every 3 days if symptoms persist    Dispense:  3 tablet    Refill:  0      Lifestyle: Body mass index is 30.55 kg/m. Wt Readings from Last 3 Encounters:  11/17/19 183 lb 9.6 oz (83.3 kg)  08/03/19 184 lb (83.5 kg)  06/16/19 183 lb (83 kg)     Patient Active Problem List   Diagnosis Date Noted  . Essential hypertension 06/25/2015    Priority: High  . Degenerative joint disease (DJD) of hip 08/03/2019    Priority: Medium    Right, xrays 2020, mild   . Osteoarthritis of lumbar spine 08/03/2019    Priority: Medium    xrays 2020   . Lung nodules, stable, next CT due 05/2020 04/27/2019    Priority: Medium  . GERD (gastroesophageal reflux disease) 01/18/2019    Priority: Medium  . Allergic asthma 06/25/2015    Priority: Medium  . Primary osteoarthritis of left knee 06/18/2015    Priority: Medium  . Total knee replacement status, bilateral 08/03/2019    Priority: Low  . Diverticulosis 01/19/2019    Priority: Low    Diffuse by colonoscopy   . B12 deficiency 01/19/2019    Priority: Low  . History of cardiac catheterization 01/18/2019   Health Maintenance  Topic Date Due  . PNA vac Low Risk Adult (1 of 2 - PCV13) 08/22/2000  . INFLUENZA VACCINE  Completed  . DEXA SCAN  Completed   Immunization History  Administered Date(s) Administered  . Influenza, High Dose Seasonal PF 07/26/2018, 06/23/2019  . PFIZER SARS-COV-2 Vaccination 10/04/2019, 10/25/2019   We updated and reviewed the  patient's past history in detail and it is documented below. Allergies: Patient is allergic to latex and penicillins. Past Medical History Patient  has a past medical history of Allergy, Arthritis, Asthma, Cancer ([place]), Chicken pox, Colon polyp, Diverticulitis, Gallstones, GERD (gastroesophageal reflux disease), High cholesterol, History of Helicobacter pylori infection (09/15/1998), Hypertension, Knee joint replaced by other means (06/26/2015), Macular degeneration, Total knee replacement status, bilateral (08/03/2019), and UTI (urinary tract infection). Past Surgical History Patient  has a past surgical history that includes Cholecystectomy (1993); Breast biopsy (1987, 1993); Appendectomy (2001); Replacement total knee (Bilateral, 2016 and 2017); Ectopic pregnancy surgery (1973); Esophagogastroduodenoscopy (2000); Colonoscopy (08/29/2014); Cataract extraction, bilateral; and Upper gastrointestinal endoscopy.  Family History: Patient family history includes Breast cancer in her mother; Colon cancer in her mother; Early death in her brother and maternal grandfather; Hypertension in her brother and mother; Lung cancer in her father; Prostate cancer in her brother. Social History:  Patient  reports that she has quit smoking. She has never used smokeless tobacco. She reports that she does not drink alcohol or use drugs.  Review of Systems: Constitutional: negative for fever or malaise Ophthalmic: negative for photophobia, double vision or loss of vision Cardiovascular: negative for chest pain, dyspnea on exertion, or new LE swelling Respiratory: negative for SOB or persistent cough Gastrointestinal: positive for abdominal pain, and change in bowel habits w/o melena Genitourinary: negative for dysuria or gross hematuria, no abnormal uterine bleeding or disharge Musculoskeletal: negative for new gait disturbance or muscular weakness Integumentary: negative for new or persistent rashes, no breast  lumps Neurological: negative for TIA or stroke symptoms Psychiatric: negative for SI or delusions Allergic/Immunologic: negative for hives  Patient Care Team    Relationship Specialty Notifications Start End  Leamon Arnt, MD PCP - General Family Medicine  06/28/19     Objective  Vitals: BP (!) 142/82 (BP Location: Left Arm, Patient Position: Sitting, Cuff Size: Normal)   Pulse 71   Temp 98.1 F (36.7 C) (Temporal)   Ht 5\' 5"  (1.651 m)   Wt 183 lb 9.6 oz (83.3 kg)   SpO2 97%   BMI 30.55 kg/m  General:  Well developed, well nourished, no acute distress  Psych:  Alert and orientedx3,normal mood and affect HEENT:  Normocephalic, atraumatic, non-icteric sclera, Cardiovascular:  Normal S1, S2, RRR without gallop, rub or murmur, nondisplaced PMI Respiratory:  Good breath sounds bilaterally, CTAB with normal respiratory effort Gastrointestinal: normal bowel sounds, soft, LLQ ttp w/o rebound or guarding, no suprapubic ttp or flank ttp, no noted masses. No HSM MSK: no deformities, contusions. Joints are without erythema or swelling. Spine and CVA region are nontender Skin:  Warm, no rashes or suspicious lesions noted Neurologic:    Mental status is normal. CN 2-11 are normal. Gross motor and sensory exams are normal. Normal gait. No tremor  Office Visit on 11/17/2019  Component Date Value Ref Range Status  . Color, UA 11/17/2019 Golden   Final  . Clarity, UA 11/17/2019 Cloudy   Final  . Glucose, UA 11/17/2019 Negative  Negative Final  . Bilirubin, UA 11/17/2019 Negative   Final  . Ketones, UA 11/17/2019 Negative   Final  . Spec Grav, UA 11/17/2019 1.025  1.010 - 1.025 Final  . Blood, UA 11/17/2019 Negative   Final  . pH, UA 11/17/2019 6.0  5.0 - 8.0 Final  . Protein, UA 11/17/2019 Negative  Negative Final  . Urobilinogen, UA 11/17/2019 0.2  0.2 or 1.0 E.U./dL Final  . Nitrite, UA 11/17/2019 Negative   Final  . Leukocytes, UA 11/17/2019 Small (1+)* Negative Final     Commons  side effects, risks, benefits, and alternatives for medications and treatment plan prescribed today were discussed, and the patient expressed understanding of the given instructions. Patient is instructed to call or message via MyChart if he/she has any questions or concerns regarding our treatment plan. No barriers to understanding were identified. We discussed Red Flag symptoms and signs in detail. Patient expressed understanding regarding what to do in case of urgent or emergency type symptoms.   Medication list was reconciled, printed and provided to the patient in AVS. Patient instructions and summary information was reviewed with the  patient as documented in the AVS. This note was prepared with assistance of Dragon voice recognition software. Occasional wrong-word or sound-a-like substitutions may have occurred due to the inherent limitations of voice recognition software  This visit occurred during the SARS-CoV-2 public health emergency.  Safety protocols were in place, including screening questions prior to the visit, additional usage of staff PPE, and extensive cleaning of exam room while observing appropriate contact time as indicated for disinfecting solutions.

## 2019-11-18 LAB — URINE CULTURE
MICRO NUMBER:: 10215024
Result:: NO GROWTH
SPECIMEN QUALITY:: ADEQUATE

## 2019-11-21 ENCOUNTER — Other Ambulatory Visit: Payer: Self-pay

## 2019-11-21 ENCOUNTER — Ambulatory Visit (INDEPENDENT_AMBULATORY_CARE_PROVIDER_SITE_OTHER): Payer: Medicare PPO

## 2019-11-21 DIAGNOSIS — Z0000 Encounter for general adult medical examination without abnormal findings: Secondary | ICD-10-CM | POA: Diagnosis not present

## 2019-11-21 NOTE — Progress Notes (Signed)
This visit is being conducted via phone call due to the COVID-19 pandemic. This patient has given me verbal consent via phone to conduct this visit, patient states they are participating from their home address. Some vital signs may be absent or patient reported.   Patient identification: identified by name, DOB, and current address.  Location provider: [facility], Office Persons participating in the virtual visit: Denman George LPN, patient, and Dr. Billey Chang    Subjective:   Renee Green is a 86 y.o. female who presents for Medicare Annual (Subsequent) preventive examination.  Review of Systems:   Cardiac Risk Factors include: advanced age (>42men, >22 women);hypertension    Objective:     Vitals: There were no vitals taken for this visit.  There is no height or weight on file to calculate BMI.  Advanced Directives 11/21/2019 11/18/2018  Does Patient Have a Medical Advance Directive? Yes No  Type of Advance Directive Living will;Healthcare Power of Attorney -  Does patient want to make changes to medical advance directive? No - Patient declined -  Copy of [place] in Chart? No - copy requested -  Would patient like information on creating a medical advance directive? - No - Patient declined    Tobacco Social History   Tobacco Use  Smoking Status Former Smoker  Smokeless Tobacco Never Used  Tobacco Comment   quit 45 years ago     Counseling given: Not Answered Comment: quit 45 years ago   Clinical Intake:  Pre-visit preparation completed: Yes  Pain : No/denies pain  Diabetes: No  How often do you need to have someone help you when you read instructions, pamphlets, or other written materials from your doctor or pharmacy?: 1 - Never  Interpreter Needed?: No  Information entered by :: Denman George LPN  Past Medical History:  Diagnosis Date  . Allergy   . Arthritis   . Asthma   . Cancer ([place])    skin cancer- face  .  Chicken pox   . Colon polyp   . Diverticulitis   . Gallstones   . GERD (gastroesophageal reflux disease)   . High cholesterol   . History of Helicobacter pylori infection 09/15/1998  . Hypertension   . Knee joint replaced by other means 06/26/2015  . Macular degeneration   . Total knee replacement status, bilateral 08/03/2019  . UTI (urinary tract infection)    Past Surgical History:  Procedure Laterality Date  . APPENDECTOMY  2001  . BREAST BIOPSY  1987, 1993  . CATARACT EXTRACTION, BILATERAL    . CHOLECYSTECTOMY  1993  . COLONOSCOPY  08/29/2014  . [place]   lost right tube   . ESOPHAGOGASTRODUODENOSCOPY  2000  . REPLACEMENT TOTAL KNEE Bilateral 2016 and 2017  . UPPER GASTROINTESTINAL ENDOSCOPY     Family History  Problem Relation Age of Onset  . Hypertension Mother   . Breast cancer Mother   . Colon cancer Mother   . Lung cancer Father   . Prostate cancer Brother   . Hypertension Brother   . Early death Maternal Grandfather   . Early death Brother   . Esophageal cancer Neg Hx   . Rectal cancer Neg Hx   . Stomach cancer Neg Hx    Social History   Socioeconomic History  . Marital status: Widowed    Spouse name: Not on file  . Number of children: 2  . Years of education: Not on file  .  Highest education level: Master's degree (e.g., MA, MS, MEng, MEd, MSW, MBA)  Occupational History  . Occupation: retired    Comment: Education officer, museum   Tobacco Use  . Smoking status: Former Research scientist (life sciences)  . Smokeless tobacco: Never Used  . Tobacco comment: quit 45 years ago  Substance and Sexual Activity  . Alcohol use: Never  . Drug use: Never  . Sexual activity: Not Currently  Other Topics Concern  . 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  Difficulty of Paying Living Expenses: Not on file  Food Insecurity:   . Worried About Charity fundraiser in the Last Year: Not on file  . Ran Out of Food in the Last Year: Not on file  Transportation Needs:   . Lack of Transportation (Medical): Not on file  . Lack of Transportation (Non-Medical): Not on file  Physical Activity:   . Days of Exercise per Week: Not on file  . Minutes of Exercise per Session: Not on file  Stress:   . Feeling of Stress : Not on file  Social Connections:   . Frequency of Communication with Friends and Family: Not on file  . Frequency of Social Gatherings with Friends and Family: Not on file  . Attends Religious Services: Not on file  . Active Member of Clubs or Organizations: Not on file  . Attends Archivist Meetings: Not on file  . Marital Status: Not on file    Outpatient Encounter Medications as of 11/21/2019  Medication Sig  . aspirin EC 81 MG tablet Take 81 mg by mouth daily.  . calcium citrate-vitamin D (CITRACAL+D) 315-200 MG-UNIT tablet Take 1 tablet by mouth 2 (two) times daily.  . Cholecalciferol (VITAMIN D3) 50 MCG (2000 UT) CHEW Chew 1 tablet by mouth 2 (two) times daily.   . ciprofloxacin (CIPRO) 500 MG tablet Take 1 tablet (500 mg total) by mouth 2 (two) times daily for 7 days.  . fluconazole (DIFLUCAN) 150 MG tablet Take one tablet in 3 days; may repeat every 3 days if symptoms persist  . fluticasone (FLONASE) 50 MCG/ACT nasal spray Place 1 spray into both nostrils daily.  Marland Kitchen losartan-hydrochlorothiazide (HYZAAR) 100-25 MG tablet Take 1 tablet by mouth daily.  . meloxicam (MOBIC) 15 MG tablet TAKE 1 TABLET BY MOUTH EVERY DAY  . metroNIDAZOLE (FLAGYL) 500 MG tablet Take 1 tablet (500 mg total) by mouth 2 (two) times daily for 7 days.  . Multiple Vitamins-Minerals (PRESERVISION AREDS 2 PO) Take 2 capsules by mouth daily.  . Omega-3 Fatty Acids (FISH OIL) 1200 MG CPDR Take 2 tablets by mouth 2 (two) times daily.   Marland Kitchen omeprazole  (PRILOSEC) 20 MG capsule Take 1 capsule (20 mg total) by mouth daily.  Vladimir Faster Glycol-Propyl Glycol (SYSTANE FREE OP) Apply to eye as needed.  . Red Yeast Rice 600 MG CAPS Take 600 mg by mouth 2 (two) times daily.   . vitamin B-12 (CYANOCOBALAMIN) 1000 MCG tablet Take 1,000 mcg by mouth daily.   No facility-administered encounter medications on file as of 11/21/2019.    Activities of Daily Living In your present state of health, do you have any difficulty performing the following activities: 11/21/2019 04/27/2019  Hearing? N N  Vision? N  N  Difficulty concentrating or making decisions? N N  Walking or climbing stairs? N N  Dressing or bathing? N N  Doing errands, shopping? N N  Preparing Food and eating ? N -  Using the Toilet? N -  In the past six months, have you accidently leaked urine? N -  Do you have problems with loss of bowel control? N -  Managing your Medications? N -  Managing your Finances? N -  Housekeeping or managing your Housekeeping? N -  Some recent data might be hidden    Patient Care Team: Leamon Arnt, MD as PCP - General (Family Medicine) Durward Fortes, MD as Consulting Physician (Ophthalmology) Loletha Carrow Kirke Corin, MD as Consulting Physician (Gastroenterology)    Assessment:   This is a routine wellness examination for Renee Green.  Exercise Activities and Dietary recommendations Current Exercise Habits: The patient does not participate in regular exercise at present  Goals   None     Fall Risk Fall Risk  11/21/2019 11/17/2019 10/20/2018  Falls in the past year? 0 0 1  Number falls in past yr: 0 0 0  Injury with Fall? 0 0 0  Risk for fall due to : Impaired vision - -  Follow up Falls evaluation completed;Education provided;Falls prevention discussed Falls evaluation completed -   Is the patient's home free of loose throw rugs in walkways, pet beds, electrical cords, etc?   yes      Grab bars in the bathroom? yes      Handrails on the stairs?   yes       Adequate lighting?  Yes   Depression Screen PHQ 2/9 Scores 11/21/2019 11/17/2019 10/20/2018  PHQ - 2 Score 0 0 0  PHQ- 9 Score - - 2     Cognitive Function- no cognitive concerns at this time    6CIT Screen 11/21/2019  What Year? 0 points  What month? 0 points  What time? 0 points  Count back from 20 0 points  Months in reverse 0 points  Repeat phrase 0 points  Total Score 0    Immunization History  Administered Date(s) Administered  . Influenza, High Dose Seasonal PF 07/26/2018, 06/23/2019  . PFIZER SARS-COV-2 Vaccination 10/04/2019, 10/25/2019  . Pneumococcal Conjugate-13 09/15/2012  . Pneumococcal Polysaccharide-23 09/15/2000    Qualifies for Shingles Vaccine?. Discussed and patient will check with pharmacy for coverage.  Patient education handout provided   Screening Tests Health Maintenance  Topic Date Due  . INFLUENZA VACCINE  Completed  . DEXA SCAN  Completed  . PNA vac Low Risk Adult  Completed    Cancer Screenings: Lung: Low Dose CT Chest recommended if Age 51-80 years, 30 pack-year currently smoking OR have quit w/in 15years. Patient does not qualify. Breast:  Up to date on Mammogram? Yes   Up to date of Bone Density/Dexa? Yes Colorectal: colonoscopy 06/16/19 with Dr. Loletha Carrow    Plan:  I have personally reviewed and addressed the Medicare Annual Wellness questionnaire and have noted the following in the patient's chart:  A. Medical and social history B. Use of alcohol, tobacco or illicit drugs  C. Current medications and supplements D. Functional ability and status E.  Nutritional status F.  Physical activity G. Advance directives H. List of other physicians I.  Hospitalizations, surgeries, and ER visits in previous 12 months J.  [place] such as hearing and vision if needed, cognitive and depression L. Referrals, records requested, and appointments- will request previous immunization  records from pcp   In addition, I have reviewed and discussed  with patient certain preventive protocols, quality metrics, and best practice recommendations. A written personalized care plan for preventive services as well as general preventive health recommendations were provided to patient.   Signed,  Denman George, LPN  Nurse Health Advisor   Nurse Notes: Patient has completed Covid vaccine

## 2019-11-21 NOTE — Patient Instructions (Signed)
Renee Green , Thank you for taking time to come for your Medicare Wellness Visit. I appreciate your ongoing commitment to your health goals. Please review the following plan we discussed and let me know if I can assist you in the future.   Screening recommendations/referrals: Colorectal Screening: up to date; last colonoscopy 06/16/19 with Dr. Loletha Carrow  Mammogram: up to date; last 06/28/19 Bone Density: up to date; last 08/16/19  Vision and Dental Exams: Recommended annual ophthalmology exams for early detection of glaucoma and other disorders of the eye Recommended annual dental exams for proper oral hygiene  Vaccinations: Influenza vaccine: completed 06/23/19 Pneumococcal vaccine: we will get records from Dr. Martyn Ehrich Tdap vaccine: recommended every to years; Please call your insurance company to determine your out of pocket expense. You may receive this vaccine at your local pharmacy or Health Dept. Shingles vaccine:You may receive this vaccine at your local pharmacy. (see handout)   Advanced directives: Please bring a copy of your POA (Power of Attorney) and/or Living Will to your next appointment.  Goals: Recommend to drink at least 6-8 8oz glasses of water per day and consume a balanced diet rich in fresh fruits and vegetables.   Next appointment: Please schedule your Annual Wellness Visit with your Nurse Health Advisor in one year.  Preventive Care 65 Years and Older, Female Preventive care refers to lifestyle choices and visits with your health care provider that can promote health and wellness. What does preventive care include?  A yearly physical exam. This is also called an annual well check.  Dental exams once or twice a year.  Routine eye exams. Ask your health care provider how often you should have your eyes checked.  Personal lifestyle choices, including:  Daily care of your teeth and gums.  Regular physical activity.  Eating a healthy diet.  Avoiding tobacco and  drug use.  Limiting alcohol use.  Practicing safe sex.  Taking low-dose aspirin every day if recommended by your health care provider.  Taking vitamin and mineral supplements as recommended by your health care provider. What happens during an annual well check? The services and screenings done by your health care provider during your annual well check will depend on your age, overall health, lifestyle risk factors, and family history of disease. Counseling  Your health care provider may ask you questions about your:  Alcohol use.  Tobacco use.  Drug use.  Emotional well-being.  Home and relationship well-being.  Sexual activity.  Eating habits.  History of falls.  Memory and ability to understand (cognition).  Work and work Statistician.  Reproductive health. Screening  You may have the following tests or measurements:  Height, weight, and BMI.  Blood pressure.  Lipid and cholesterol levels. These may be checked every 5 years, or more frequently if you are over 38 years old.  Skin check.  Lung cancer screening. You may have this screening every year starting at age 40 if you have a 30-pack-year history of smoking and currently smoke or have quit within the past 15 years.  Fecal occult blood test (FOBT) of the stool. You may have this test every year starting at age 71.  Flexible sigmoidoscopy or colonoscopy. You may have a sigmoidoscopy every 5 years or a colonoscopy every 10 years starting at age 37.  Hepatitis C blood test.  Hepatitis B blood test.  Sexually transmitted disease (STD) testing.  Diabetes screening. This is done by checking your blood sugar (glucose) after you have not eaten for a  while (fasting). You may have this done every 1-3 years.  Bone density scan. This is done to screen for osteoporosis. You may have this done starting at age 35.  Mammogram. This may be done every 1-2 years. Talk to your health care provider about how often you  should have regular mammograms. Talk with your health care provider about your test results, treatment options, and if necessary, the need for more tests. Vaccines  Your health care provider may recommend certain vaccines, such as:  Influenza vaccine. This is recommended every year.  Tetanus, diphtheria, and acellular pertussis (Tdap, Td) vaccine. You may need a Td booster every 10 years.  Zoster vaccine. You may need this after age 59.  Pneumococcal 13-valent conjugate (PCV13) vaccine. One dose is recommended after age 51.  Pneumococcal polysaccharide (PPSV23) vaccine. One dose is recommended after age 25. Talk to your health care provider about which screenings and vaccines you need and how often you need them. This information is not intended to replace advice given to you by your health care provider. Make sure you discuss any questions you have with your health care provider. Document Released: 09/28/2015 Document Revised: 05/21/2016 Document Reviewed: 07/03/2015 Elsevier Interactive Patient Education  2017 [employer] Prevention in the Home Falls can cause injuries. They can happen to people of all ages. There are many things you can do to make your home safe and to help prevent falls. What can I do on the outside of my home?  Regularly fix the edges of walkways and driveways and fix any cracks.  Remove anything that might make you trip as you walk through a door, such as a raised step or threshold.  Trim any bushes or trees on the path to your home.  Use bright outdoor lighting.  Clear any walking paths of anything that might make someone trip, such as rocks or tools.  Regularly check to see if handrails are loose or broken. Make sure that both sides of any steps have handrails.  Any raised decks and porches should have guardrails on the edges.  Have any leaves, snow, or ice cleared regularly.  Use sand or salt on walking paths during winter.  Clean up any  spills in your garage right away. This includes oil or grease spills. What can I do in the bathroom?  Use night lights.  Install grab bars by the toilet and in the tub and shower. Do not use towel bars as grab bars.  Use non-skid mats or decals in the tub or shower.  If you need to sit down in the shower, use a plastic, non-slip stool.  Keep the floor dry. Clean up any water that spills on the floor as soon as it happens.  Remove soap buildup in the tub or shower regularly.  Attach bath mats securely with double-sided non-slip rug tape.  Do not have throw rugs and other things on the floor that can make you trip. What can I do in the bedroom?  Use night lights.  Make sure that you have a light by your bed that is easy to reach.  Do not use any sheets or blankets that are too big for your bed. They should not hang down onto the floor.  Have a firm chair that has side arms. You can use this for support while you get dressed.  Do not have throw rugs and other things on the floor that can make you trip. What can I do in the  kitchen?  Clean up any spills right away.  Avoid walking on wet floors.  Keep items that you use a lot in easy-to-reach places.  If you need to reach something above you, use a strong step stool that has a grab bar.  Keep electrical cords out of the way.  Do not use floor polish or wax that makes floors slippery. If you must use wax, use non-skid floor wax.  Do not have throw rugs and other things on the floor that can make you trip. What can I do with my stairs?  Do not leave any items on the stairs.  Make sure that there are handrails on both sides of the stairs and use them. Fix handrails that are broken or loose. Make sure that handrails are as long as the stairways.  Check any carpeting to make sure that it is firmly attached to the stairs. Fix any carpet that is loose or worn.  Avoid having throw rugs at the top or bottom of the stairs. If you  do have throw rugs, attach them to the floor with carpet tape.  Make sure that you have a light switch at the top of the stairs and the bottom of the stairs. If you do not have them, ask someone to add them for you. What else can I do to help prevent falls?  Wear shoes that:  Do not have high heels.  Have rubber bottoms.  Are comfortable and fit you well.  Are closed at the toe. Do not wear sandals.  If you use a stepladder:  Make sure that it is fully opened. Do not climb a closed stepladder.  Make sure that both sides of the stepladder are locked into place.  Ask someone to hold it for you, if possible.  Clearly mark and make sure that you can see:  Any grab bars or handrails.  First and last steps.  Where the edge of each step is.  Use tools that help you move around (mobility aids) if they are needed. These include:  Canes.  Walkers.  Scooters.  Crutches.  Turn on the lights when you go into a dark area. Replace any light bulbs as soon as they burn out.  Set up your furniture so you have a clear path. Avoid moving your furniture around.  If any of your floors are uneven, fix them.  If there are any pets around you, be aware of where they are.  Review your medicines with your doctor. Some medicines can make you feel dizzy. This can increase your chance of falling. Ask your doctor what other things that you can do to help prevent falls. This information is not intended to replace advice given to you by your health care provider. Make sure you discuss any questions you have with your health care provider. Document Released: 06/28/2009 Document Revised: 02/07/2016 Document Reviewed: 10/06/2014 Elsevier Interactive Patient Education  2017 [employer].

## 2020-02-01 ENCOUNTER — Telehealth: Payer: Self-pay | Admitting: Physical Medicine and Rehabilitation

## 2020-02-01 NOTE — Telephone Encounter (Signed)
Ok to repeat right hip injection? This was last done 05/26/2019, and patient reports that it worked well, but the pain returned recently.

## 2020-02-01 NOTE — Telephone Encounter (Signed)
Ok

## 2020-02-02 NOTE — Telephone Encounter (Signed)
Scheduled for 5/27 0815.

## 2020-02-09 ENCOUNTER — Ambulatory Visit (INDEPENDENT_AMBULATORY_CARE_PROVIDER_SITE_OTHER): Payer: Medicare PPO | Admitting: Physical Medicine and Rehabilitation

## 2020-02-09 ENCOUNTER — Encounter: Payer: Self-pay | Admitting: Physical Medicine and Rehabilitation

## 2020-02-09 ENCOUNTER — Other Ambulatory Visit: Payer: Self-pay

## 2020-02-09 ENCOUNTER — Ambulatory Visit: Payer: Self-pay

## 2020-02-09 DIAGNOSIS — M25551 Pain in right hip: Secondary | ICD-10-CM | POA: Diagnosis not present

## 2020-02-09 NOTE — Progress Notes (Signed)
Renee Green - 86 y.o. female MRN [id number]  Date of birth: [birth year]  Office Visit Note: Visit Date: 02/09/2020 PCP: Leamon Arnt, MD Referred by: Leamon Arnt, MD  Subjective: Chief Complaint  Patient presents with  . Right Hip - Pain   HPI:  Renee Green is a 86 y.o. female who comes in today For planned repeat right intra-articular hip injection with fluoroscopic guidance. Patient was seen last September for evaluation and she was seen at that time to have what felt to be mostly hip related pain but did have some symptoms that could be consistent with lumbar radicular type complaints. We did complete intra-articular anesthetic hip arthrogram at the time and she reports 100% relief up until just recently. No new trauma or other problems. She does get some referral down past the knee without paresthesias. This could still be hip related pain I have seen referral patterns go past the knee and a few people. We'll complete the hip injection again today since it did well. Would consider reevaluation for more lumbar problem should this not help.  ROS Otherwise per HPI.  Assessment & Plan: Visit Diagnoses:  1. Right hip pain     Plan: No additional findings.   Meds & Orders: No orders of the defined types were placed in this encounter.   Orders Placed This Encounter  Procedures  . Large Joint Inj: R hip joint  . XR C-ARM NO REPORT    Follow-up: No follow-ups on file.   Procedures: Large Joint Inj: R hip joint on 02/09/2020 8:19 AM Indications: diagnostic evaluation and pain Details: 22 G 3.5 in needle, fluoroscopy-guided anterior approach  Arthrogram: No  Medications: 4 mL bupivacaine 0.25 %; 60 mg triamcinolone acetonide 40 MG/ML Outcome: tolerated well, no immediate complications  There was excellent flow of contrast producing a partial arthrogram of the hip. The patient did have relief of symptoms during the anesthetic phase of the  injection. Procedure, treatment alternatives, risks and benefits explained, specific risks discussed. Consent was given by the patient. Immediately prior to procedure a time out was called to verify the correct patient, procedure, equipment, support staff and site/side marked as required. Patient was prepped and draped in the usual sterile fashion.      No notes on file   Clinical History: EXAM: DG HIP (WITH OR WITHOUT PELVIS) 2-3V RIGHT  COMPARISON:  None.  FINDINGS: Mild joint space narrowing right hip. Negative for fracture or AVN. No focal bony lesion. Minimal spurring  Calcified uterine fibroid.  Mild degenerative change left hip  IMPRESSION: Mild to moderate degenerative change right hip without acute abnormality.   Electronically Signed   By: Franchot Gallo M.D.   On: 04/28/2019 14:51     Objective:  VS:  HT:    WT:   BMI:     BP:   HR: bpm  TEMP: ( )  RESP:  Physical Exam Constitutional:      General: She is not in acute distress.    Appearance: Normal appearance. She is not ill-appearing.  HENT:     Head: Normocephalic and atraumatic.     Right Ear: External ear normal.     Left Ear: External ear normal.  Eyes:     Extraocular Movements: Extraocular movements intact.  Cardiovascular:     Rate and Rhythm: Normal rate.     Pulses: Normal pulses.  Musculoskeletal:     Right lower leg: No edema.     Left lower  leg: No edema.     Comments: Patient has good distal strength with no pain over the greater trochanters.  No clonus or focal weakness. Concordant hip pain with rotation on the right.  Skin:    Findings: No erythema, lesion or rash.  Neurological:     General: No focal deficit present.     Mental Status: She is alert and oriented to person, place, and time.     Sensory: No sensory deficit.     Motor: No weakness or abnormal muscle tone.     Coordination: Coordination normal.  Psychiatric:        Mood and Affect: Mood normal.         Behavior: Behavior normal.      Imaging: XR C-ARM NO REPORT  Result Date: 02/09/2020 Please see Notes tab for imaging impression.

## 2020-02-09 NOTE — Progress Notes (Signed)
Pt states pain is in the back of right hip into the groin and  down the leg into the right foot. Pt states pain is better with heat. Pt states she is unsure as to what increase her pain. She does notice more pain when sitting long periods  Numeric Pain Rating Scale and Functional Assessment Average Pain (7)   In the last MONTH (on 0-10 scale) has pain interfered with the following?  1. General activity like being  able to carry out your everyday physical activities such as walking, climbing stairs, carrying groceries, or moving a chair?  Rating(7)   -Driver, -BT, -Dye Allergies.

## 2020-02-10 MED ORDER — BUPIVACAINE HCL 0.25 % IJ SOLN
4.0000 mL | INTRAMUSCULAR | Status: AC | PRN
  Administered 2020-02-09: 4 mL via INTRA_ARTICULAR

## 2020-02-10 MED ORDER — TRIAMCINOLONE ACETONIDE 40 MG/ML IJ SUSP
60.0000 mg | INTRAMUSCULAR | Status: AC | PRN
  Administered 2020-02-09: 60 mg via INTRA_ARTICULAR

## 2020-03-14 DIAGNOSIS — M5416 Radiculopathy, lumbar region: Secondary | ICD-10-CM | POA: Diagnosis not present

## 2020-03-14 DIAGNOSIS — M25551 Pain in right hip: Secondary | ICD-10-CM | POA: Diagnosis not present

## 2020-04-03 DIAGNOSIS — M545 Low back pain: Secondary | ICD-10-CM | POA: Diagnosis not present

## 2020-04-10 ENCOUNTER — Emergency Department (HOSPITAL_BASED_OUTPATIENT_CLINIC_OR_DEPARTMENT_OTHER)
Admission: EM | Admit: 2020-04-10 | Discharge: 2020-04-10 | Disposition: A | Discharge status: Home / Self Care | Payer: Medicare PPO | Attending: Emergency Medicine | Admitting: Emergency Medicine

## 2020-04-10 ENCOUNTER — Encounter (HOSPITAL_BASED_OUTPATIENT_CLINIC_OR_DEPARTMENT_OTHER): Payer: Self-pay | Admitting: *Deleted

## 2020-04-10 ENCOUNTER — Other Ambulatory Visit: Payer: Self-pay

## 2020-04-10 ENCOUNTER — Emergency Department (HOSPITAL_BASED_OUTPATIENT_CLINIC_OR_DEPARTMENT_OTHER): Payer: Medicare PPO

## 2020-04-10 ENCOUNTER — Telehealth: Payer: Self-pay | Admitting: Family Medicine

## 2020-04-10 DIAGNOSIS — Z7982 Long term (current) use of aspirin: Secondary | ICD-10-CM | POA: Insufficient documentation

## 2020-04-10 DIAGNOSIS — Z85828 Personal history of other malignant neoplasm of skin: Secondary | ICD-10-CM | POA: Insufficient documentation

## 2020-04-10 DIAGNOSIS — R1013 Epigastric pain: Secondary | ICD-10-CM | POA: Diagnosis not present

## 2020-04-10 DIAGNOSIS — Z91040 Latex allergy status: Secondary | ICD-10-CM | POA: Insufficient documentation

## 2020-04-10 DIAGNOSIS — I10 Essential (primary) hypertension: Secondary | ICD-10-CM | POA: Diagnosis not present

## 2020-04-10 DIAGNOSIS — Z87891 Personal history of nicotine dependence: Secondary | ICD-10-CM | POA: Insufficient documentation

## 2020-04-10 DIAGNOSIS — Z79899 Other long term (current) drug therapy: Secondary | ICD-10-CM | POA: Insufficient documentation

## 2020-04-10 DIAGNOSIS — K5730 Diverticulosis of large intestine without perforation or abscess without bleeding: Secondary | ICD-10-CM | POA: Diagnosis not present

## 2020-04-10 DIAGNOSIS — K838 Other specified diseases of biliary tract: Secondary | ICD-10-CM | POA: Diagnosis not present

## 2020-04-10 DIAGNOSIS — J45909 Unspecified asthma, uncomplicated: Secondary | ICD-10-CM | POA: Insufficient documentation

## 2020-04-10 LAB — CBC WITH DIFFERENTIAL/PLATELET
Abs Immature Granulocytes: 0.02 10*3/uL (ref 0.00–0.07)
Basophils Absolute: 0 10*3/uL (ref 0.0–0.1)
Basophils Relative: 1 %
Eosinophils Absolute: 0.2 10*3/uL (ref 0.0–0.5)
Eosinophils Relative: 3 %
HCT: 36.7 % (ref 36.0–46.0)
Hemoglobin: 12.4 g/dL (ref 12.0–15.0)
Immature Granulocytes: 0 %
Lymphocytes Relative: 29 %
Lymphs Abs: 1.8 10*3/uL (ref 0.7–4.0)
MCH: 31.2 pg (ref 26.0–34.0)
MCHC: 33.8 g/dL (ref 30.0–36.0)
MCV: 92.2 fL (ref 80.0–100.0)
Monocytes Absolute: 0.4 10*3/uL (ref 0.1–1.0)
Monocytes Relative: 7 %
Neutro Abs: 3.8 10*3/uL (ref 1.7–7.7)
Neutrophils Relative %: 60 %
Platelets: 307 10*3/uL (ref 150–400)
RBC: 3.98 MIL/uL (ref 3.87–5.11)
RDW: 12.4 % (ref 11.5–15.5)
WBC: 6.3 10*3/uL (ref 4.0–10.5)
nRBC: 0 % (ref 0.0–0.2)

## 2020-04-10 LAB — COMPREHENSIVE METABOLIC PANEL
ALT: 22 U/L (ref 0–44)
AST: 22 U/L (ref 15–41)
Albumin: 3.8 g/dL (ref 3.5–5.0)
Alkaline Phosphatase: 67 U/L (ref 38–126)
Anion gap: 13 (ref 5–15)
BUN: 23 mg/dL (ref 8–23)
CO2: 24 mmol/L (ref 22–32)
Calcium: 8.9 mg/dL (ref 8.9–10.3)
Chloride: 96 mmol/L — ABNORMAL LOW (ref 98–111)
Creatinine, Ser: 0.92 mg/dL (ref 0.44–1.00)
GFR calc Af Amer: >60 mL/min (ref >60)
GFR calc non Af Amer: 57 mL/min — ABNORMAL LOW (ref >60)
Glucose, Bld: 128 mg/dL — ABNORMAL HIGH (ref 70–99)
Potassium: 3.3 mmol/L — ABNORMAL LOW (ref 3.5–5.1)
Sodium: 133 mmol/L — ABNORMAL LOW (ref 135–145)
Total Bilirubin: 0.6 mg/dL (ref 0.3–1.2)
Total Protein: 6.8 g/dL (ref 6.5–8.1)

## 2020-04-10 LAB — URINALYSIS, ROUTINE W REFLEX MICROSCOPIC
Bilirubin Urine: NEGATIVE
Glucose, UA: NEGATIVE mg/dL
Hgb urine dipstick: NEGATIVE
Ketones, ur: NEGATIVE mg/dL
Nitrite: NEGATIVE
Protein, ur: NEGATIVE mg/dL
Specific Gravity, Urine: 1.01 (ref 1.005–1.030)
pH: 6.5 (ref 5.0–8.0)

## 2020-04-10 LAB — URINALYSIS, MICROSCOPIC (REFLEX)

## 2020-04-10 LAB — TROPONIN I (HIGH SENSITIVITY): Troponin I (High Sensitivity): 4 ng/L (ref <18)

## 2020-04-10 LAB — LIPASE, BLOOD: Lipase: 42 U/L (ref 11–51)

## 2020-04-10 MED ORDER — PANTOPRAZOLE SODIUM 20 MG PO TBEC
20.0000 mg | DELAYED_RELEASE_TABLET | Freq: Every day | ORAL | 0 refills | Status: DC
Start: 2020-04-10 — End: 2020-05-30

## 2020-04-10 MED ORDER — SODIUM CHLORIDE 0.9 % IV BOLUS
1000.0000 mL | Freq: Once | INTRAVENOUS | Status: AC
  Administered 2020-04-10: 1000 mL via INTRAVENOUS

## 2020-04-10 MED ORDER — LIDOCAINE VISCOUS HCL 2 % MT SOLN
15.0000 mL | Freq: Once | OROMUCOSAL | Status: AC
  Administered 2020-04-10: 15 mL via ORAL
  Filled 2020-04-10: qty 15

## 2020-04-10 MED ORDER — IOHEXOL 300 MG/ML  SOLN
100.0000 mL | Freq: Once | INTRAMUSCULAR | Status: AC
  Administered 2020-04-10: 100 mL via INTRAVENOUS

## 2020-04-10 MED ORDER — ALUM & MAG HYDROXIDE-SIMETH 200-200-20 MG/5ML PO SUSP
30.0000 mL | Freq: Once | ORAL | Status: AC
  Administered 2020-04-10: 30 mL via ORAL
  Filled 2020-04-10: qty 30

## 2020-04-10 NOTE — ED Notes (Signed)
EDP Delo at bedside; EDP verbalized discontinuation of second troponin draw

## 2020-04-10 NOTE — ED Notes (Signed)
Vital signs stable. 

## 2020-04-10 NOTE — Telephone Encounter (Signed)
Pt called complaining of upper abdominal pain and slight fever. Pt states she is unable to sleep due to the pain. Transferred pt to [facility] for triage.

## 2020-04-10 NOTE — ED Notes (Signed)
Patient transported to CT 

## 2020-04-10 NOTE — Telephone Encounter (Signed)
Noted. Will wait on triage nurse note

## 2020-04-10 NOTE — ED Provider Notes (Signed)
[facility] Provider Note   CSN: [id number] Arrival date & time: 04/10/20  1313     History Chief Complaint  Patient presents with   Abdominal Pain    Renee Green is a 86 y.o. female.  Patient is an 86 year old female with history of hypertension, GERD, hyperlipidemia, prior cholecystectomy, bilateral total knee replacement.  She presents today for evaluation of abdominal pain.  Patient reports a 1 week history of constant pain to the epigastric region.  There is no radiation to the arm or jaw, fever, or shortness of breath.  She denies any diarrhea or vomiting, but does feel nauseated.  Patient states the pain makes it difficult for her to sleep at night.  She called her doctor who advised her to come here for further evaluation.  The history is provided by the patient.  Abdominal Pain Pain location:  Epigastric Pain quality: gnawing   Pain radiates to:  Does not radiate Pain severity:  Moderate Onset quality:  Gradual Duration:  1 week Timing:  Constant Progression:  Worsening Chronicity:  New Relieved by:  Nothing Worsened by:  Movement and palpation      Past Medical History:  Diagnosis Date   Allergy    Arthritis    Asthma    Cancer ([place])    skin cancer- face   Chicken pox    Colon polyp    Diverticulitis    Gallstones    GERD (gastroesophageal reflux disease)    High cholesterol    History of Helicobacter pylori infection 09/15/1998   Hypertension    Knee joint replaced by other means 06/26/2015   Macular degeneration    Total knee replacement status, bilateral 08/03/2019   UTI (urinary tract infection)     Patient Active Problem List   Diagnosis Date Noted   Degenerative joint disease (DJD) of hip 08/03/2019   Osteoarthritis of lumbar spine 08/03/2019   Total knee replacement status, bilateral 08/03/2019   Lung nodules, stable, next CT due 05/2020 04/27/2019   Diverticulosis 01/19/2019     B12 deficiency 01/19/2019   GERD (gastroesophageal reflux disease) 01/18/2019   History of cardiac catheterization 01/18/2019   Essential hypertension 06/25/2015   Allergic asthma 06/25/2015   Primary osteoarthritis of left knee 06/18/2015    Past Surgical History:  Procedure Laterality Date   APPENDECTOMY  2001   BREAST BIOPSY  1987, 1993   CATARACT EXTRACTION, BILATERAL     CHOLECYSTECTOMY  1993   COLONOSCOPY  08/29/2014   ECTOPIC PREGNANCY SURGERY  1973   lost right tube    ESOPHAGOGASTRODUODENOSCOPY  2000   REPLACEMENT TOTAL KNEE Bilateral 2016 and 2017   UPPER GASTROINTESTINAL ENDOSCOPY       OB History   No obstetric history on file.     Family History  Problem Relation Age of Onset   Hypertension Mother    Breast cancer Mother    Colon cancer Mother    Lung cancer Father    Prostate cancer Brother    Hypertension Brother    Early death Maternal Grandfather    Early death Brother    Esophageal cancer Neg Hx    Rectal cancer Neg Hx    Stomach cancer Neg Hx     Social History   Tobacco Use   Smoking status: Former Smoker   Smokeless tobacco: Never Used   Tobacco comment: quit 45 years ago  Vaping Use   Vaping Use: Never used  Substance Use Topics  Alcohol use: Never   Drug use: Never    Home Medications Prior to Admission medications   Medication Sig Start Date End Date Taking? Authorizing Provider  tizanidine (ZANAFLEX) 2 MG capsule Take 2 mg by mouth 3 (three) times daily.   Yes [provider]  aspirin EC 81 MG tablet Take 81 mg by mouth daily.    [provider]  Cholecalciferol (VITAMIN D3) 50 MCG (2000 UT) CHEW Chew 1 tablet by mouth 2 (two) times daily.     [provider]  fluticasone (FLONASE) 50 MCG/ACT nasal spray Place 1 spray into both nostrils daily. 11/17/19   Leamon Arnt, MD  losartan-hydrochlorothiazide (HYZAAR) 100-25 MG tablet Take 1 tablet by mouth daily. 11/17/19    Leamon Arnt, MD  meloxicam (MOBIC) 15 MG tablet TAKE 1 TABLET BY MOUTH EVERY DAY 10/17/19   Leamon Arnt, MD  Multiple Vitamins-Minerals (PRESERVISION AREDS 2 PO) Take 2 capsules by mouth daily.    [provider]  Omega-3 Fatty Acids (FISH OIL) 1200 MG CPDR Take 2 tablets by mouth 2 (two) times daily.     [provider]  omeprazole (PRILOSEC) 20 MG capsule Take 1 capsule (20 mg total) by mouth daily. 04/27/19   Briscoe Deutscher, DO  Red Yeast Rice 600 MG CAPS Take 600 mg by mouth 2 (two) times daily.     [provider]  vitamin B-12 (CYANOCOBALAMIN) 1000 MCG tablet Take 1,000 mcg by mouth daily.    [provider]    Allergies    Latex and Penicillins  Review of Systems   Review of Systems  Gastrointestinal: Positive for abdominal pain.  All other systems reviewed and are negative.   Physical Exam Updated Vital Signs BP (!) 137/65 (BP Location: Right Arm)    Pulse 75    Temp 98.4 F (36.9 C) (Oral)    Resp 19    Ht 5\' 5"  (1.651 m)    Wt 82.6 kg    SpO2 100%    BMI 30.29 kg/m   Physical Exam Vitals and nursing note reviewed.  Constitutional:      General: She is not in acute distress.    Appearance: She is well-developed. She is not diaphoretic.  HENT:     Head: Normocephalic and atraumatic.  Cardiovascular:     Rate and Rhythm: Normal rate and regular rhythm.     Heart sounds: No murmur heard.  No friction rub. No gallop.   Pulmonary:     Effort: Pulmonary effort is normal. No respiratory distress.     Breath sounds: Normal breath sounds. No wheezing.  Abdominal:     General: Bowel sounds are normal. There is no distension.     Palpations: Abdomen is soft.     Tenderness: There is abdominal tenderness in the epigastric area. There is no right CVA tenderness, left CVA tenderness, guarding or rebound.  Musculoskeletal:        General: Normal range of motion.     Cervical back: Normal range of motion and neck supple.  Skin:     General: Skin is warm and dry.  Neurological:     Mental Status: She is alert and oriented to person, place, and time.     ED Results / Procedures / Treatments   Labs (all labs ordered are listed, but only abnormal results are displayed) Labs Reviewed  CBC WITH DIFFERENTIAL/PLATELET  COMPREHENSIVE METABOLIC PANEL  LIPASE, BLOOD  URINALYSIS, ROUTINE W REFLEX MICROSCOPIC  TROPONIN  I (HIGH SENSITIVITY)    EKG EKG Interpretation  Date/Time:  Tuesday April 10 2020 13:26:52 EDT Ventricular Rate:  76 PR Interval:  154 QRS Duration: 86 QT Interval:  374 QTC Calculation: 420 R Axis:   47 Text Interpretation: Normal sinus rhythm Normal ECG No STEMI Confirmed by Octaviano Glow [phone]) on 04/10/2020 2:10:03 PM   Radiology DG Chest 2 View  Result Date: 04/10/2020 CLINICAL DATA:  Epigastric pain, hypertension EXAM: CHEST - 2 VIEW COMPARISON:  10/20/2018 FINDINGS: The heart size and mediastinal contours are within normal limits. Atherosclerotic calcification of the aortic knob. Both lungs are clear. The visualized skeletal structures are unremarkable. IMPRESSION: No active cardiopulmonary disease. Electronically Signed   By: Davina Poke D.O.   On: 04/10/2020 13:43    Procedures Procedures (including critical care time)  Medications Ordered in ED Medications  sodium chloride 0.9 % bolus 1,000 mL (has no administration in time range)  alum & mag hydroxide-simeth (MAALOX/MYLANTA) 200-200-20 MG/5ML suspension 30 mL (has no administration in time range)    And  lidocaine (XYLOCAINE) 2 % viscous mouth solution 15 mL (has no administration in time range)    ED Course  I have reviewed the triage vital signs and the nursing notes.  Pertinent labs & imaging results that were available during my care of the patient were reviewed by me and considered in my medical decision making (see chart for details).    MDM Rules/Calculators/A&P  Patient presenting here with complaints of  abdominal pain for the past 8 days.  Her discomfort is in the epigastric region and is constant.  This does not sound cardiac in her EKG is unchanged and troponin is negative.  I suspect a GI cause.  Her laboratory studies are reassuring with no leukocytosis, anemia, or abnormalities in her LFTs or lipase.  Symptoms likely gastritis as she did have some improvement with a GI cocktail here in the ER.  At this point, I feel as though discharge is appropriate.  Patient will be prescribed Protonix and advised to follow-up with her primary doctor in the next week.  Final Clinical Impression(s) / ED Diagnoses Final diagnoses:  None    Rx / DC Orders ED Discharge Orders    None       Veryl Speak, MD 04/10/20 [phone]

## 2020-04-10 NOTE — ED Triage Notes (Signed)
Epigastric pain for a week. States the pain is preventing her from sleeping.

## 2020-04-10 NOTE — ED Notes (Signed)
NPO, verbalized understanding

## 2020-04-10 NOTE — ED Notes (Signed)
ED Provider at bedside. 

## 2020-04-10 NOTE — Discharge Instructions (Addendum)
Begin taking Protonix as prescribed.  Follow-up with your primary doctor if symptoms or not improving in the next week, and return to the ER if you develop worsening pain, high fever, bloody stool or vomit, or other new and concerning symptoms.

## 2020-04-24 DIAGNOSIS — M25551 Pain in right hip: Secondary | ICD-10-CM | POA: Diagnosis not present

## 2020-05-02 DIAGNOSIS — M25551 Pain in right hip: Secondary | ICD-10-CM | POA: Diagnosis not present

## 2020-05-07 DIAGNOSIS — M25551 Pain in right hip: Secondary | ICD-10-CM | POA: Diagnosis not present

## 2020-05-09 DIAGNOSIS — M25551 Pain in right hip: Secondary | ICD-10-CM | POA: Diagnosis not present

## 2020-05-10 DIAGNOSIS — G5701 Lesion of sciatic nerve, right lower limb: Secondary | ICD-10-CM | POA: Diagnosis not present

## 2020-05-10 DIAGNOSIS — M47816 Spondylosis without myelopathy or radiculopathy, lumbar region: Secondary | ICD-10-CM | POA: Diagnosis not present

## 2020-05-14 DIAGNOSIS — M25551 Pain in right hip: Secondary | ICD-10-CM | POA: Diagnosis not present

## 2020-05-17 DIAGNOSIS — M25551 Pain in right hip: Secondary | ICD-10-CM | POA: Diagnosis not present

## 2020-05-18 ENCOUNTER — Other Ambulatory Visit: Payer: Self-pay | Admitting: Family Medicine

## 2020-05-18 DIAGNOSIS — Z1231 Encounter for screening mammogram for malignant neoplasm of breast: Secondary | ICD-10-CM

## 2020-05-28 DIAGNOSIS — M25551 Pain in right hip: Secondary | ICD-10-CM | POA: Diagnosis not present

## 2020-05-30 ENCOUNTER — Ambulatory Visit: Payer: Medicare PPO | Admitting: Family Medicine

## 2020-05-30 ENCOUNTER — Other Ambulatory Visit: Payer: Self-pay

## 2020-05-30 ENCOUNTER — Encounter: Payer: Self-pay | Admitting: Family Medicine

## 2020-05-30 VITALS — BP 152/82 | HR 72 | Temp 98.0°F | Ht 66.0 in | Wt 182.5 lb

## 2020-05-30 DIAGNOSIS — I10 Essential (primary) hypertension: Secondary | ICD-10-CM | POA: Diagnosis not present

## 2020-05-30 DIAGNOSIS — M47816 Spondylosis without myelopathy or radiculopathy, lumbar region: Secondary | ICD-10-CM | POA: Diagnosis not present

## 2020-05-30 DIAGNOSIS — R918 Other nonspecific abnormal finding of lung field: Secondary | ICD-10-CM

## 2020-05-30 DIAGNOSIS — E782 Mixed hyperlipidemia: Secondary | ICD-10-CM | POA: Diagnosis not present

## 2020-05-30 DIAGNOSIS — K219 Gastro-esophageal reflux disease without esophagitis: Secondary | ICD-10-CM

## 2020-05-30 DIAGNOSIS — Z23 Encounter for immunization: Secondary | ICD-10-CM

## 2020-05-30 DIAGNOSIS — M1712 Unilateral primary osteoarthritis, left knee: Secondary | ICD-10-CM | POA: Diagnosis not present

## 2020-05-30 DIAGNOSIS — M25551 Pain in right hip: Secondary | ICD-10-CM | POA: Diagnosis not present

## 2020-05-30 MED ORDER — AMLODIPINE BESYLATE 5 MG PO TABS: 5.0000 mg | ORAL_TABLET | Freq: Every day | ORAL | 3 refills | Status: DC

## 2020-05-30 MED ORDER — ROSUVASTATIN CALCIUM 5 MG PO TABS: 5.0000 mg | ORAL_TABLET | Freq: Every day | ORAL | 3 refills | Status: DC

## 2020-05-30 MED ORDER — PANTOPRAZOLE SODIUM 20 MG PO TBEC: 20.0000 mg | DELAYED_RELEASE_TABLET | Freq: Every day | ORAL | 3 refills | Status: AC

## 2020-05-30 NOTE — Progress Notes (Signed)
Subjective  CC:  Chief Complaint  Patient presents with  . Hypertension    6 month  . Gastroesophageal Reflux  . Osteoarthritis  . Hyperlipidemia    HPI: Renee Green is a 86 y.o. female who presents to the office today to address the problems listed above in the chief complaint.  Hypertension f/u: Control is fair . Pt reports she is doing well. taking medications as instructed, no medication side effects noted, no TIAs, no chest pain on exertion, no dyspnea on exertion, no swelling of ankles. However, remains above goal.  She denies adverse effects from his BP medications. Compliance with medication is good.   GERD: had ER eval. I reviewed notes. Changed to protonix for uncontrolled GERD sxs and is now doing well again. Request refill  Back and knee pain: seeing ortho and PT  HLD not on statin due to h/o myalgias while taking simvastatin in the past.  She does not want more leg cramps but would be willing to try another.  She is on aspirin therapy  History of lung nodules on CT.  Reviewed most recent CT scan.  Stable lung nodules.  Her low risk patient no further follow-up recommended.  If she were high risk we could recheck in 12 to 18 months.  She has a remote smoking history, greater than 45 years ago.  No lung disease.  No pulmonary symptoms.  Covid vaccination education.  Has questions about Covid booster she would be eligible in October.  Health maintenance: Due for flu vaccine.  Has mammogram scheduled for November.  Assessment  1. Essential hypertension   2. Gastroesophageal reflux disease without esophagitis   3. Primary osteoarthritis of left knee   4. Multiple lung nodules on CT   5. Spondylosis of lumbar region without myelopathy or radiculopathy   6. Mixed hyperlipidemia   7. Need for influenza vaccination      Plan    Hypertension f/u: BP control is poorly controlled.  Add low-dose amlodipine.  Continue current combination medication.  Recheck 3  months.  Lung nodules: Educated.  No further testing recommended at this time.  GERD: Change Protonix, chronic.  Well-controlled  Hyperlipidemia f/u: Recommend retrial of statin, low-dose Crestor initiated.  Start once weekly and titrate up if tolerated.  Patient agrees.  Flu shot given today.  She did have a localized allergic reaction in the past.  Recommend Benadryl when she gets home.  No history of anaphylaxis.  Follow-up if needed. Education regarding management of these chronic disease states was given. Management strategies discussed on successive visits include dietary and exercise recommendations, goals of achieving and maintaining IBW, and lifestyle modifications aiming for adequate sleep and minimizing stressors.   Follow up: 3 months for hypertension recheck  Orders Placed This Encounter  Procedures  . Flu Vaccine QUAD High Dose(Fluad)   Meds ordered this encounter  Medications  . amLODipine (NORVASC) 5 MG tablet    Sig: Take 1 tablet (5 mg total) by mouth daily.    Dispense:  90 tablet    Refill:  3  . rosuvastatin (CRESTOR) 5 MG tablet    Sig: Take 1 tablet (5 mg total) by mouth daily.    Dispense:  30 tablet    Refill:  3  . pantoprazole (PROTONIX) 20 MG tablet    Sig: Take 1 tablet (20 mg total) by mouth daily.    Dispense:  90 tablet    Refill:  3      BP Readings  from Last 3 Encounters:  05/30/20 (!) 152/82  04/10/20 (!) 158/58  11/17/19 (!) 142/82   Wt Readings from Last 3 Encounters:  05/30/20 182 lb 8 oz (82.8 kg)  04/10/20 182 lb (82.6 kg)  11/17/19 183 lb 9.6 oz (83.3 kg)    Lab Results  Component Value Date   CHOL 219 (H) 11/17/2019   CHOL 187 07/15/2018   Lab Results  Component Value Date   HDL 49.80 11/17/2019   HDL 49 07/15/2018   Lab Results  Component Value Date   LDLCALC 147 (H) 11/17/2019   LDLCALC 125 07/15/2018   Lab Results  Component Value Date   TRIG 107.0 11/17/2019   TRIG 64 07/15/2018   Lab Results  Component  Value Date   CHOLHDL 4 11/17/2019   No results found for: LDLDIRECT Lab Results  Component Value Date   CREATININE 0.92 04/10/2020   BUN 23 04/10/2020   NA 133 (L) 04/10/2020   K 3.3 (L) 04/10/2020   CL 96 (L) 04/10/2020   CO2 24 04/10/2020    The ASCVD Risk score (Goff DC Jr., et al., 2013) failed to calculate for the following reasons:   The 2013 ASCVD risk score is only valid for ages 74 to 51  I reviewed the patients updated PMH, FH, and SocHx.    Patient Active Problem List   Diagnosis Date Noted  . Essential hypertension 06/25/2015    Priority: High  . Degenerative joint disease (DJD) of hip 08/03/2019    Priority: Medium  . Osteoarthritis of lumbar spine 08/03/2019    Priority: Medium  . Lung nodule, multiple 04/27/2019    Priority: Medium  . GERD (gastroesophageal reflux disease) 01/18/2019    Priority: Medium  . Allergic asthma 06/25/2015    Priority: Medium  . Primary osteoarthritis of left knee 06/18/2015    Priority: Medium  . Total knee replacement status, bilateral 08/03/2019    Priority: Low  . Diverticulosis 01/19/2019    Priority: Low  . B12 deficiency 01/19/2019    Priority: Low  . History of cardiac catheterization 01/18/2019    Allergies: Latex and Penicillins  Social History: Patient  reports that she has quit smoking. She has never used smokeless tobacco. She reports that she does not drink alcohol and does not use drugs.  Current Meds  Medication Sig  . aspirin EC 81 MG tablet Take 81 mg by mouth daily.  . Cholecalciferol (VITAMIN D3) 50 MCG (2000 UT) CHEW Chew 1 tablet by mouth 2 (two) times daily.   . fluticasone (FLONASE) 50 MCG/ACT nasal spray Place 1 spray into both nostrils daily.  Marland Kitchen losartan-hydrochlorothiazide (HYZAAR) 100-25 MG tablet Take 1 tablet by mouth daily.  . meloxicam (MOBIC) 15 MG tablet TAKE 1 TABLET BY MOUTH EVERY DAY  . Multiple Vitamins-Minerals (PRESERVISION AREDS 2 PO) Take 2 capsules by mouth daily.  . Omega-3  Fatty Acids (FISH OIL) 1200 MG CPDR Take 2 tablets by mouth 2 (two) times daily.   . pantoprazole (PROTONIX) 20 MG tablet Take 1 tablet (20 mg total) by mouth daily.  . Red Yeast Rice 600 MG CAPS Take 600 mg by mouth 2 (two) times daily.   . tizanidine (ZANAFLEX) 2 MG capsule Take 2 mg by mouth at bedtime.   . vitamin B-12 (CYANOCOBALAMIN) 1000 MCG tablet Take 1,000 mcg by mouth daily.  . [DISCONTINUED] pantoprazole (PROTONIX) 20 MG tablet Take 1 tablet (20 mg total) by mouth daily.    Review of Systems: Cardiovascular: negative  for chest pain, palpitations, leg swelling, orthopnea Respiratory: negative for SOB, wheezing or persistent cough Gastrointestinal: negative for abdominal pain Genitourinary: negative for dysuria or gross hematuria  Objective  Vitals: BP (!) 152/82 (BP Location: Left Arm, Patient Position: Sitting, Cuff Size: Normal)   Pulse 72   Temp 98 F (36.7 C) (Oral)   Ht 5\' 6"  (1.676 m)   Wt 182 lb 8 oz (82.8 kg)   SpO2 95%   BMI 29.46 kg/m  General: no acute distress  Psych:  Alert and oriented, normal mood and affect HEENT:  Normocephalic, atraumatic, supple neck  Cardiovascular:  RRR without murmur. no edema Respiratory:  Good breath sounds bilaterally, CTAB with normal respiratory effort Skin:  Warm, no rashes Neurologic:   Mental status is normal  Commons side effects, risks, benefits, and alternatives for medications and treatment plan prescribed today were discussed, and the patient expressed understanding of the given instructions. Patient is instructed to call or message via MyChart if he/she has any questions or concerns regarding our treatment plan. No barriers to understanding were identified. We discussed Red Flag symptoms and signs in detail. Patient expressed understanding regarding what to do in case of urgent or emergency type symptoms.   Medication list was reconciled, printed and provided to the patient in AVS. Patient instructions and summary  information was reviewed with the patient as documented in the AVS. This note was prepared with assistance of Dragon voice recognition software. Occasional wrong-word or sound-a-like substitutions may have occurred due to the inherent limitations of voice recognition software  This visit occurred during the SARS-CoV-2 public health emergency.  Safety protocols were in place, including screening questions prior to the visit, additional usage of staff PPE, and extensive cleaning of exam room while observing appropriate contact time as indicated for disinfecting solutions.

## 2020-05-30 NOTE — Patient Instructions (Signed)
Please return in 3 months to recheck your blood pressure on the new medication.   Today you were given your flu vaccination.   Try the crestor once - 3x/ week to see if you tolerate it.   If you have any questions or concerns, please don't hesitate to send me a message via MyChart or call the office at [phone]. Thank you for visiting with [place] today! It's our pleasure caring for you.

## 2020-06-12 DIAGNOSIS — M25551 Pain in right hip: Secondary | ICD-10-CM | POA: Diagnosis not present

## 2020-06-14 DIAGNOSIS — M25551 Pain in right hip: Secondary | ICD-10-CM | POA: Diagnosis not present

## 2020-06-18 DIAGNOSIS — G5701 Lesion of sciatic nerve, right lower limb: Secondary | ICD-10-CM | POA: Diagnosis not present

## 2020-06-18 DIAGNOSIS — M47816 Spondylosis without myelopathy or radiculopathy, lumbar region: Secondary | ICD-10-CM | POA: Diagnosis not present

## 2020-06-28 ENCOUNTER — Ambulatory Visit
Admission: RE | Admit: 2020-06-28 | Discharge: 2020-06-28 | Disposition: A | Discharge status: Home / Self Care | Payer: Medicare PPO | Source: Ambulatory Visit | Attending: Family Medicine | Admitting: Family Medicine

## 2020-06-28 ENCOUNTER — Other Ambulatory Visit: Payer: Self-pay

## 2020-06-28 DIAGNOSIS — Z1231 Encounter for screening mammogram for malignant neoplasm of breast: Secondary | ICD-10-CM | POA: Diagnosis not present

## 2020-06-29 DIAGNOSIS — G5701 Lesion of sciatic nerve, right lower limb: Secondary | ICD-10-CM | POA: Diagnosis not present

## 2020-07-16 DIAGNOSIS — G5701 Lesion of sciatic nerve, right lower limb: Secondary | ICD-10-CM | POA: Diagnosis not present

## 2020-07-16 DIAGNOSIS — M47816 Spondylosis without myelopathy or radiculopathy, lumbar region: Secondary | ICD-10-CM | POA: Diagnosis not present

## 2020-08-15 DIAGNOSIS — L570 Actinic keratosis: Secondary | ICD-10-CM | POA: Diagnosis not present

## 2020-08-15 DIAGNOSIS — Z85828 Personal history of other malignant neoplasm of skin: Secondary | ICD-10-CM | POA: Diagnosis not present

## 2020-08-15 DIAGNOSIS — L720 Epidermal cyst: Secondary | ICD-10-CM | POA: Diagnosis not present

## 2020-08-15 DIAGNOSIS — L578 Other skin changes due to chronic exposure to nonionizing radiation: Secondary | ICD-10-CM | POA: Diagnosis not present

## 2020-08-21 ENCOUNTER — Other Ambulatory Visit: Payer: Self-pay | Admitting: Family Medicine

## 2020-08-28 ENCOUNTER — Encounter: Payer: Self-pay | Admitting: Family Medicine

## 2020-08-28 ENCOUNTER — Other Ambulatory Visit: Payer: Self-pay

## 2020-08-28 ENCOUNTER — Ambulatory Visit (INDEPENDENT_AMBULATORY_CARE_PROVIDER_SITE_OTHER): Payer: Medicare PPO | Admitting: Family Medicine

## 2020-08-28 VITALS — BP 132/60 | HR 76 | Temp 97.4°F | Ht 66.0 in | Wt 187.8 lb

## 2020-08-28 DIAGNOSIS — I10 Essential (primary) hypertension: Secondary | ICD-10-CM

## 2020-08-28 MED ORDER — SHINGRIX 50 MCG/0.5ML IM SUSR
0.5000 mL | Freq: Once | INTRAMUSCULAR | 0 refills | Status: AC
Start: 2020-08-28 — End: 2020-08-28

## 2020-08-28 MED ORDER — ROSUVASTATIN CALCIUM 5 MG PO TABS: ORAL_TABLET | ORAL | 1 refills | Status: AC

## 2020-08-28 MED ORDER — AMLODIPINE BESYLATE 5 MG PO TABS
10.0000 mg | ORAL_TABLET | Freq: Every day | ORAL | 3 refills | Status: DC
Start: 2020-08-28 — End: 2020-09-12

## 2020-08-28 NOTE — Patient Instructions (Addendum)
Please return in 3 months for your annual complete physical; please come fasting.  Please increase your amlodipine to 10mg  daily and let me know early next year if your blood pressure is stable on that dose. Let me know if you have any problems tolerating it as well.   Please take the prescription for Shingrix to the pharmacy so they may administer the vaccinations. Your insurance will then cover the injections.    If you have any questions or concerns, please don't hesitate to send me a message via MyChart or call the office at [phone]. Thank you for visiting with [place] today! It's our pleasure caring for you.

## 2020-08-28 NOTE — Progress Notes (Signed)
Subjective  CC:  Chief Complaint  Patient presents with  . Hypertension    BP readings at home between 135-160/70's     HPI: Renee Green is a 86 y.o. female who presents to the office today to address the problems listed above in the chief complaint.  Hypertension f/u: last visit we added amlodipine 5mg  to combo Hyzaar for better bp control. Control is improved.. Pt reports she is doing well. taking medications as instructed, no medication side effects noted, no TIAs, no chest pain on exertion, no dyspnea on exertion, no swelling of ankles. Tolerating well. No ankle edema. Afternoon bps are consistently elevated with systolics avg [id number]. No lightheadedness.  She denies adverse effects from his BP medications. Compliance with medication is good.   Assessment  1. Essential hypertension      Plan    Hypertension f/u: BP control is fairly well controlled. Increase to amlodipine 10 and continue home monitoring. Monitor for orthostatic sxs or am lightheadedness. Pt to message me with results in a month.  Education regarding management of these chronic disease states was given. Management strategies discussed on successive visits include dietary and exercise recommendations, goals of achieving and maintaining IBW, and lifestyle modifications aiming for adequate sleep and minimizing stressors.   Follow up: 3 months for cpe and bp check  No orders of the defined types were placed in this encounter.  Meds ordered this encounter  Medications  . rosuvastatin (CRESTOR) 5 MG tablet    Sig: Mon, Wed, and Fri only    Dispense:  90 tablet    Refill:  1      BP Readings from Last 3 Encounters:  08/28/20 132/60  05/30/20 (!) 152/82  04/10/20 (!) 158/58   Wt Readings from Last 3 Encounters:  08/28/20 187 lb 12.8 oz (85.2 kg)  05/30/20 182 lb 8 oz (82.8 kg)  04/10/20 182 lb (82.6 kg)    Lab Results  Component Value Date   CHOL 219 (H) 11/17/2019   CHOL 187 07/15/2018    Lab Results  Component Value Date   HDL 49.80 11/17/2019   HDL 49 07/15/2018   Lab Results  Component Value Date   LDLCALC 147 (H) 11/17/2019   LDLCALC 125 07/15/2018   Lab Results  Component Value Date   TRIG 107.0 11/17/2019   TRIG 64 07/15/2018   Lab Results  Component Value Date   CHOLHDL 4 11/17/2019   No results found for: LDLDIRECT Lab Results  Component Value Date   CREATININE 0.92 04/10/2020   BUN 23 04/10/2020   NA 133 (L) 04/10/2020   K 3.3 (L) 04/10/2020   CL 96 (L) 04/10/2020   CO2 24 04/10/2020    The ASCVD Risk score (Goff DC Jr., et al., 2013) failed to calculate for the following reasons:   The 2013 ASCVD risk score is only valid for ages 6 to 24  I reviewed the patients updated PMH, FH, and SocHx.    Patient Active Problem List   Diagnosis Date Noted  . Essential hypertension 06/25/2015    Priority: High  . Degenerative joint disease (DJD) of hip 08/03/2019    Priority: Medium  . Osteoarthritis of lumbar spine 08/03/2019    Priority: Medium  . Lung nodule, multiple 04/27/2019    Priority: Medium  . GERD (gastroesophageal reflux disease) 01/18/2019    Priority: Medium  . Allergic asthma 06/25/2015    Priority: Medium  . Primary osteoarthritis of left knee 06/18/2015  Priority: Medium  . Total knee replacement status, bilateral 08/03/2019    Priority: Low  . Diverticulosis 01/19/2019    Priority: Low  . B12 deficiency 01/19/2019    Priority: Low  . History of cardiac catheterization 01/18/2019    Allergies: Latex and Penicillins  Social History: Patient  reports that she has quit smoking. She has never used smokeless tobacco. She reports that she does not drink alcohol and does not use drugs.  Current Meds  Medication Sig  . amLODipine (NORVASC) 5 MG tablet Take 1 tablet (5 mg total) by mouth daily.  Marland Kitchen aspirin EC 81 MG tablet Take 81 mg by mouth daily.  . Cholecalciferol (VITAMIN D3) 50 MCG (2000 UT) CHEW Chew 1 tablet by  mouth 2 (two) times daily.   . fluticasone (FLONASE) 50 MCG/ACT nasal spray Place 1 spray into both nostrils daily.  Marland Kitchen losartan-hydrochlorothiazide (HYZAAR) 100-25 MG tablet Take 1 tablet by mouth daily.  . meloxicam (MOBIC) 15 MG tablet TAKE 1 TABLET BY MOUTH EVERY DAY  . Multiple Vitamins-Minerals (PRESERVISION AREDS 2 PO) Take 2 capsules by mouth daily.  . Omega-3 Fatty Acids (FISH OIL) 1200 MG CPDR Take 2 tablets by mouth 2 (two) times daily.   . pantoprazole (PROTONIX) 20 MG tablet Take 1 tablet (20 mg total) by mouth daily.  . Red Yeast Rice 600 MG CAPS Take 600 mg by mouth 2 (two) times daily.   . tizanidine (ZANAFLEX) 2 MG capsule Take 2 mg by mouth at bedtime.   . vitamin B-12 (CYANOCOBALAMIN) 1000 MCG tablet Take 1,000 mcg by mouth daily.  . [DISCONTINUED] rosuvastatin (CRESTOR) 5 MG tablet TAKE 1 TABLET BY MOUTH EVERY DAY (Patient taking differently: Mon, Wed, and Fri only)    Review of Systems: Cardiovascular: negative for chest pain, palpitations, leg swelling, orthopnea Respiratory: negative for SOB, wheezing or persistent cough Gastrointestinal: negative for abdominal pain Genitourinary: negative for dysuria or gross hematuria  Objective  Vitals: BP 132/60   Pulse 76   Temp (!) 97.4 F (36.3 C) (Temporal)   Ht 5\' 6"  (1.676 m)   Wt 187 lb 12.8 oz (85.2 kg)   SpO2 97%   BMI 30.31 kg/m  General: no acute distress  Psych:  Alert and oriented, normal mood and affect Cardiovascular:  RRR without murmur. no edema Respiratory:  Good breath sounds bilaterally, CTAB with normal respiratory effort   Commons side effects, risks, benefits, and alternatives for medications and treatment plan prescribed today were discussed, and the patient expressed understanding of the given instructions. Patient is instructed to call or message via MyChart if he/she has any questions or concerns regarding our treatment plan. No barriers to understanding were identified. We discussed Red Flag  symptoms and signs in detail. Patient expressed understanding regarding what to do in case of urgent or emergency type symptoms.   Medication list was reconciled, printed and provided to the patient in AVS. Patient instructions and summary information was reviewed with the patient as documented in the AVS. This note was prepared with assistance of Dragon voice recognition software. Occasional wrong-word or sound-a-like substitutions may have occurred due to the inherent limitations of voice recognition software  This visit occurred during the SARS-CoV-2 public health emergency.  Safety protocols were in place, including screening questions prior to the visit, additional usage of staff PPE, and extensive cleaning of exam room while observing appropriate contact time as indicated for disinfecting solutions.

## 2020-09-03 DIAGNOSIS — H52222 Regular astigmatism, left eye: Secondary | ICD-10-CM | POA: Diagnosis not present

## 2020-09-03 DIAGNOSIS — H353132 Nonexudative age-related macular degeneration, bilateral, intermediate dry stage: Secondary | ICD-10-CM | POA: Diagnosis not present

## 2020-09-03 DIAGNOSIS — H5212 Myopia, left eye: Secondary | ICD-10-CM | POA: Diagnosis not present

## 2020-09-03 DIAGNOSIS — H524 Presbyopia: Secondary | ICD-10-CM | POA: Diagnosis not present

## 2020-09-03 DIAGNOSIS — H52201 Unspecified astigmatism, right eye: Secondary | ICD-10-CM | POA: Diagnosis not present

## 2020-09-03 DIAGNOSIS — H5201 Hypermetropia, right eye: Secondary | ICD-10-CM | POA: Diagnosis not present

## 2020-09-03 DIAGNOSIS — Z961 Presence of intraocular lens: Secondary | ICD-10-CM | POA: Diagnosis not present

## 2020-09-11 ENCOUNTER — Encounter: Payer: Self-pay | Admitting: Family Medicine

## 2020-09-12 ENCOUNTER — Other Ambulatory Visit: Payer: Self-pay

## 2020-09-12 ENCOUNTER — Encounter: Payer: Self-pay | Admitting: Family Medicine

## 2020-09-12 ENCOUNTER — Ambulatory Visit: Payer: Medicare PPO | Admitting: Family Medicine

## 2020-09-12 VITALS — BP 130/66 | HR 71 | Temp 98.2°F | Resp 18 | Ht 66.0 in | Wt 193.0 lb

## 2020-09-12 DIAGNOSIS — I10 Essential (primary) hypertension: Secondary | ICD-10-CM

## 2020-09-12 DIAGNOSIS — R600 Localized edema: Secondary | ICD-10-CM | POA: Diagnosis not present

## 2020-09-12 DIAGNOSIS — G5603 Carpal tunnel syndrome, bilateral upper limbs: Secondary | ICD-10-CM

## 2020-09-12 DIAGNOSIS — M19042 Primary osteoarthritis, left hand: Secondary | ICD-10-CM | POA: Diagnosis not present

## 2020-09-12 DIAGNOSIS — E876 Hypokalemia: Secondary | ICD-10-CM | POA: Diagnosis not present

## 2020-09-12 DIAGNOSIS — M19041 Primary osteoarthritis, right hand: Secondary | ICD-10-CM

## 2020-09-12 NOTE — Patient Instructions (Signed)
Please follow up as scheduled for your next visit with me: 11/27/2020   I will release your lab results to you on your MyChart account with further instructions. Please reply with any questions.  I will order another blood pressure medication next week after your labs return and after your swelling is improving. IF your swelling does not improve off the amlodpine, let me know.   Get wrist splints and wear them at night.   If you have any questions or concerns, please don't hesitate to send me a message via MyChart or call the office at [phone]. Thank you for visiting with Renee Green today! It's our pleasure caring for you.   Carpal Tunnel Syndrome  Carpal tunnel syndrome is a condition that causes pain in your hand and arm. The carpal tunnel is a narrow area located on the palm side of your wrist. Repeated wrist motion or certain diseases may cause swelling within the tunnel. This swelling pinches the main nerve in the wrist (median nerve). What are the causes? This condition may be caused by:  Repeated wrist motions.  Wrist injuries.  Arthritis.  A cyst or tumor in the carpal tunnel.  Fluid buildup during pregnancy. Sometimes the cause of this condition is not known. What increases the risk? The following factors may make you more likely to develop this condition:  Having a job, such as being a Research scientist (life sciences), that requires you to repeatedly move your wrist in the same motion.  Being a woman.  Having certain conditions, such as: ? Diabetes. ? Obesity. ? An underactive thyroid (hypothyroidism). ? Kidney failure. What are the signs or symptoms? Symptoms of this condition include:  A tingling feeling in your fingers, especially in your thumb, index, and middle fingers.  Tingling or numbness in your hand.  An aching feeling in your entire arm, especially when your wrist and elbow are bent for a long time.  Wrist pain that goes up your arm to your shoulder.  Pain that  goes down into your palm or fingers.  A weak feeling in your hands. You may have trouble grabbing and holding items. Your symptoms may feel worse during the night. How is this diagnosed? This condition is diagnosed with a medical history and physical exam. You may also have tests, including:  Electromyogram (EMG). This test measures electrical signals sent by your nerves into the muscles.  Nerve conduction study. This test measures how well electrical signals pass through your nerves.  Imaging tests, such as X-rays, ultrasound, and MRI. These tests check for possible causes of your condition. How is this treated? This condition may be treated with:  Lifestyle changes. It is important to stop or change the activity that caused your condition.  Doing exercise and activities to strengthen your muscles and bones (physical therapy).  Learning how to use your hand again after diagnosis (occupational therapy).  Medicines for pain and inflammation. This may include medicine that is injected into your wrist.  A wrist splint.  Surgery. Follow these instructions at home: If you have a splint:  Wear the splint as told by your health care provider. Remove it only as told by your health care provider.  Loosen the splint if your fingers tingle, become numb, or turn cold and blue.  Keep the splint clean.  If the splint is not waterproof: ? Do not let it get wet. ? Cover it with a watertight covering when you take a bath or shower. Managing pain, stiffness, and swelling  If directed, put ice on the painful area: ? If you have a removable splint, remove it as told by your health care provider. ? Put ice in a plastic bag. ? Place a towel between your skin and the bag. ? Leave the ice on for 20 minutes, 2-3 times per day. General instructions  Take over-the-counter and prescription medicines only as told by your health care provider.  Rest your wrist from any activity that may be  causing your pain. If your condition is work related, talk with your employer about changes that can be made, such as getting a wrist pad to use while typing.  Do any exercises as told by your health care provider, physical therapist, or occupational therapist.  Keep all follow-up visits as told by your health care provider. This is important. Contact a health care provider if:  You have new symptoms.  Your pain is not controlled with medicines.  Your symptoms get worse. Get help right away if:  You have severe numbness or tingling in your wrist or hand. Summary  Carpal tunnel syndrome is a condition that causes pain in your hand and arm.  It is usually caused by repeated wrist motions.  Lifestyle changes and medicines are used to treat carpal tunnel syndrome. Surgery may be recommended.  Follow your health care provider's instructions about wearing a splint, resting from activity, keeping follow-up visits, and calling for help. This information is not intended to replace advice given to you by your health care provider. Make sure you discuss any questions you have with your health care provider. Document Revised: 01/08/2018 Document Reviewed: 01/08/2018 Elsevier Patient Education  2020 [employer].

## 2020-09-12 NOTE — Progress Notes (Signed)
Subjective  CC:  Chief Complaint  Patient presents with  . Medication Reaction    Patient mentioned that she has noticed swelling in her left leg, her hands are swelling, painful to walk and she feels bloated since she started Amlodipine    HPI: Renee Green is a 86 y.o. female who presents to the office today to address the problems listed above in the chief complaint.  Hypertension f/u: Back in September we added amlodipine 5 mg daily.  At her follow-up visit blood pressure had improved and she is tolerating it.  We increased it to 10 mg daily and now she explains she is having bilateral lower extremity edema.  This is bothersome.  Having trouble fitting into shoes.  No calf pain.  She also feels puffy everywhere else.  She has been on the losartan HCT for the last 3 years.  Was not having problems with that.  Lab review from July shows that she was hypokalemic back then.  Follow-up did not include repeat testing.  She denies muscle cramps, chest pain or shortness of breath.  Home blood pressure readings show averages ranging between 130-160 over 70s to 80s.  Her home blood pressure readings are higher than they are here.  She did not have her cuff with her today  Complains of bilateral hand numbness and tingling at night.  Improved when she shakes her hands out.  She explained she has been doing a lot of knitting.  No red hot swollen joints.  She does have osteoarthritis and has daily aching in her hands.  This is unchanged.  No neck pain.  Assessment  1. Lower extremity edema   2. Essential hypertension   3. Bilateral carpal tunnel syndrome   4. Primary osteoarthritis of both hands   5. Hypokalemia      Plan    Hypertension f/u and bilateral lower extremity edema: BP control is fairly well controlled.  However having lower extremity edema due to amlodipine.  Stop amlodipine.  [facility] lab work.  Will add beta-blocker most likely.  But first we will try to improve lower  extremity edema.  May need to change from HCTZ to Lasix if not proving.  Education given.  She will follow-up in office with her blood pressure cuff next visit.  Reassured.  Check renal function and electrolytes  Bilateral carpal tunnel syndrome: Most likely diagnosis.  Recommend wrist splints.  Decreasing knitting and follow-up if not improving.  Reassured.  Unlikely be related to blood pressure medications  Education regarding management of these chronic disease states was given. Management strategies discussed on successive visits include dietary and exercise recommendations, goals of achieving and maintaining IBW, and lifestyle modifications aiming for adequate sleep and minimizing stressors.   Follow up: As scheduled  Orders Placed This Encounter  Procedures  . TSH  . Comprehensive metabolic panel  . CBC with Differential/Platelet   No orders of the defined types were placed in this encounter.     BP Readings from Last 3 Encounters:  09/12/20 130/66  08/28/20 132/60  05/30/20 (!) 152/82   Wt Readings from Last 3 Encounters:  09/12/20 193 lb (87.5 kg)  08/28/20 187 lb 12.8 oz (85.2 kg)  05/30/20 182 lb 8 oz (82.8 kg)    Lab Results  Component Value Date   CHOL 219 (H) 11/17/2019   CHOL 187 07/15/2018   Lab Results  Component Value Date   HDL 49.80 11/17/2019   HDL 49 07/15/2018   Lab  Results  Component Value Date   LDLCALC 147 (H) 11/17/2019   LDLCALC 125 07/15/2018   Lab Results  Component Value Date   TRIG 107.0 11/17/2019   TRIG 64 07/15/2018   Lab Results  Component Value Date   CHOLHDL 4 11/17/2019   No results found for: LDLDIRECT Lab Results  Component Value Date   CREATININE 0.92 04/10/2020   BUN 23 04/10/2020   NA 133 (L) 04/10/2020   K 3.3 (L) 04/10/2020   CL 96 (L) 04/10/2020   CO2 24 04/10/2020    The ASCVD Risk score (Goff DC Jr., et al., 2013) failed to calculate for the following reasons:   The 2013 ASCVD risk score is only valid for  ages 38 to 25  I reviewed the patients updated PMH, FH, and SocHx.    Patient Active Problem List   Diagnosis Date Noted  . Essential hypertension 06/25/2015    Priority: High  . Degenerative joint disease (DJD) of hip 08/03/2019    Priority: Medium  . Osteoarthritis of lumbar spine 08/03/2019    Priority: Medium  . Lung nodule, multiple 04/27/2019    Priority: Medium  . GERD (gastroesophageal reflux disease) 01/18/2019    Priority: Medium  . Allergic asthma 06/25/2015    Priority: Medium  . Primary osteoarthritis of left knee 06/18/2015    Priority: Medium  . Total knee replacement status, bilateral 08/03/2019    Priority: Low  . Diverticulosis 01/19/2019    Priority: Low  . B12 deficiency 01/19/2019    Priority: Low  . History of cardiac catheterization 01/18/2019    Allergies: Latex and Penicillins  Social History: Patient  reports that she has quit smoking. She has never used smokeless tobacco. She reports that she does not drink alcohol and does not use drugs.  Current Meds  Medication Sig  . aspirin EC 81 MG tablet Take 81 mg by mouth daily.  . Cholecalciferol (VITAMIN D3) 50 MCG (2000 UT) CHEW Chew 1 tablet by mouth 2 (two) times daily.   . fluticasone (FLONASE) 50 MCG/ACT nasal spray Place 1 spray into both nostrils daily.  Marland Kitchen losartan-hydrochlorothiazide (HYZAAR) 100-25 MG tablet Take 1 tablet by mouth daily.  . meloxicam (MOBIC) 15 MG tablet TAKE 1 TABLET BY MOUTH EVERY DAY  . Multiple Vitamins-Minerals (PRESERVISION AREDS 2 PO) Take 2 capsules by mouth daily.  . pantoprazole (PROTONIX) 20 MG tablet Take 1 tablet (20 mg total) by mouth daily. (Patient taking differently: Take 20 mg by mouth as needed.)  . rosuvastatin (CRESTOR) 5 MG tablet Mon, Wed, and Fri only  . vitamin B-12 (CYANOCOBALAMIN) 1000 MCG tablet Take 1,000 mcg by mouth daily.  . [DISCONTINUED] amLODipine (NORVASC) 5 MG tablet Take 2 tablets (10 mg total) by mouth daily. (Patient taking differently:  Take 5 mg by mouth daily.)    Review of Systems: Cardiovascular: negative for chest pain, palpitations, leg swelling, orthopnea Respiratory: negative for SOB, wheezing or persistent cough Gastrointestinal: negative for abdominal pain Genitourinary: negative for dysuria or gross hematuria  Objective  Vitals: BP 130/66   Pulse 71   Temp 98.2 F (36.8 C) (Temporal)   Resp 18   Ht 5\' 6"  (1.676 m)   Wt 193 lb (87.5 kg)   SpO2 98%   BMI 31.15 kg/m  General: no acute distress  Psych:  Alert and oriented, normal mood and affect HEENT:  Normocephalic, atraumatic, supple neck  Cardiovascular:  RRR without murmur.  Trace pitting edema bilateral lower extremities Respiratory:  Good  breath sounds bilaterally, CTAB with normal respiratory effort Skin:  Warm, no rashes Bilateral hand OA changes without erythema or redness.  Negative Phalen's, negative Tinel's Neg phalens normal grip Neurologic:   Mental status is normal  Commons side effects, risks, benefits, and alternatives for medications and treatment plan prescribed today were discussed, and the patient expressed understanding of the given instructions. Patient is instructed to call or message via MyChart if he/she has any questions or concerns regarding our treatment plan. No barriers to understanding were identified. We discussed Red Flag symptoms and signs in detail. Patient expressed understanding regarding what to do in case of urgent or emergency type symptoms.   Medication list was reconciled, printed and provided to the patient in AVS. Patient instructions and summary information was reviewed with the patient as documented in the AVS. This note was prepared with assistance of Dragon voice recognition software. Occasional wrong-word or sound-a-like substitutions may have occurred due to the inherent limitations of voice recognition software  This visit occurred during the SARS-CoV-2 public health emergency.  Safety protocols were in  place, including screening questions prior to the visit, additional usage of staff PPE, and extensive cleaning of exam room while observing appropriate contact time as indicated for disinfecting solutions.

## 2020-09-13 LAB — CBC WITH DIFFERENTIAL/PLATELET
Basophils Absolute: 0.1 10*3/uL (ref 0.0–0.1)
Basophils Relative: 1 % (ref 0.0–3.0)
Eosinophils Absolute: 0.4 10*3/uL (ref 0.0–0.7)
Eosinophils Relative: 4.5 % (ref 0.0–5.0)
HCT: 33 % — ABNORMAL LOW (ref 36.0–46.0)
Hemoglobin: 11.3 g/dL — ABNORMAL LOW (ref 12.0–15.0)
Lymphocytes Relative: 28.8 % (ref 12.0–46.0)
Lymphs Abs: 2.5 10*3/uL (ref 0.7–4.0)
MCHC: 34.2 g/dL (ref 30.0–36.0)
MCV: 91.4 fl (ref 78.0–100.0)
Monocytes Absolute: 0.6 10*3/uL (ref 0.1–1.0)
Monocytes Relative: 7.2 % (ref 3.0–12.0)
Neutro Abs: 5.2 10*3/uL (ref 1.4–7.7)
Neutrophils Relative %: 58.5 % (ref 43.0–77.0)
Platelets: 279 10*3/uL (ref 150.0–400.0)
RBC: 3.61 Mil/uL — ABNORMAL LOW (ref 3.87–5.11)
RDW: 13.3 % (ref 11.5–15.5)
WBC: 8.8 10*3/uL (ref 4.0–10.5)

## 2020-09-13 LAB — COMPREHENSIVE METABOLIC PANEL
ALT: 20 U/L (ref 0–35)
AST: 22 U/L (ref 0–37)
Albumin: 4 g/dL (ref 3.5–5.2)
Alkaline Phosphatase: 64 U/L (ref 39–117)
BUN: 39 mg/dL — ABNORMAL HIGH (ref 6–23)
CO2: 29 mEq/L (ref 19–32)
Calcium: 9.3 mg/dL (ref 8.4–10.5)
Chloride: 103 mEq/L (ref 96–112)
Creatinine, Ser: 1.18 mg/dL (ref 0.40–1.20)
GFR: 42.25 mL/min — ABNORMAL LOW (ref >60.00)
Glucose, Bld: 97 mg/dL (ref 70–99)
Potassium: 4.4 mEq/L (ref 3.5–5.1)
Sodium: 139 mEq/L (ref 135–145)
Total Bilirubin: 0.4 mg/dL (ref 0.2–1.2)
Total Protein: 6.6 g/dL (ref 6.0–8.3)

## 2020-09-13 LAB — TSH: TSH: 5.17 u[IU]/mL — ABNORMAL HIGH (ref 0.35–4.50)

## 2020-09-13 NOTE — Progress Notes (Signed)
Please add on t3 and free t4, dx: abnl tsh Thanks, Dr. Precious Segall '

## 2020-09-17 NOTE — Progress Notes (Signed)
Add on tests have been faxed to [facility]

## 2020-09-17 NOTE — Progress Notes (Signed)
Did free t4 and t3 ever get added?

## 2020-09-20 ENCOUNTER — Other Ambulatory Visit: Payer: Self-pay

## 2020-09-20 ENCOUNTER — Other Ambulatory Visit (INDEPENDENT_AMBULATORY_CARE_PROVIDER_SITE_OTHER): Payer: Medicare PPO

## 2020-09-20 DIAGNOSIS — R7989 Other specified abnormal findings of blood chemistry: Secondary | ICD-10-CM

## 2020-09-20 NOTE — Addendum Note (Signed)
Addended by: Francille Wittmann P on: 09/20/2020 02:23 PM   Modules accepted: Orders  

## 2020-09-20 NOTE — Addendum Note (Signed)
Addended by: Lurlean Horns on: 09/20/2020 02:23 PM   Modules accepted: Orders

## 2020-09-20 NOTE — Addendum Note (Signed)
Addended by: Ziair Penson P on: 09/20/2020 02:23 PM   Modules accepted: Orders  

## 2020-09-21 LAB — T4, FREE: Free T4: 0.9 ng/dL (ref 0.8–1.8)

## 2020-09-21 LAB — T3: T3, Total: 102 ng/dL (ref 76–181)

## 2020-10-02 ENCOUNTER — Other Ambulatory Visit: Payer: Self-pay | Admitting: Family Medicine

## 2020-10-02 DIAGNOSIS — M2550 Pain in unspecified joint: Secondary | ICD-10-CM

## 2020-10-25 ENCOUNTER — Other Ambulatory Visit: Payer: Self-pay | Admitting: Family Medicine

## 2020-11-27 ENCOUNTER — Ambulatory Visit: Payer: Medicare PPO | Admitting: Family Medicine

## 2021-01-08 DIAGNOSIS — J301 Allergic rhinitis due to pollen: Secondary | ICD-10-CM | POA: Diagnosis not present

## 2021-01-08 DIAGNOSIS — I10 Essential (primary) hypertension: Secondary | ICD-10-CM | POA: Diagnosis not present

## 2021-01-08 DIAGNOSIS — M2550 Pain in unspecified joint: Secondary | ICD-10-CM | POA: Diagnosis not present

## 2021-01-08 DIAGNOSIS — E782 Mixed hyperlipidemia: Secondary | ICD-10-CM | POA: Diagnosis not present

## 2021-01-08 DIAGNOSIS — J309 Allergic rhinitis, unspecified: Secondary | ICD-10-CM | POA: Diagnosis not present

## 2021-01-21 DIAGNOSIS — E782 Mixed hyperlipidemia: Secondary | ICD-10-CM | POA: Diagnosis not present

## 2021-01-21 DIAGNOSIS — I10 Essential (primary) hypertension: Secondary | ICD-10-CM | POA: Diagnosis not present

## 2021-01-22 DIAGNOSIS — Z7189 Other specified counseling: Secondary | ICD-10-CM | POA: Diagnosis not present

## 2021-01-22 DIAGNOSIS — L821 Other seborrheic keratosis: Secondary | ICD-10-CM | POA: Diagnosis not present

## 2021-01-22 DIAGNOSIS — L538 Other specified erythematous conditions: Secondary | ICD-10-CM | POA: Diagnosis not present

## 2021-01-22 DIAGNOSIS — D1801 Hemangioma of skin and subcutaneous tissue: Secondary | ICD-10-CM | POA: Diagnosis not present

## 2021-01-22 DIAGNOSIS — L578 Other skin changes due to chronic exposure to nonionizing radiation: Secondary | ICD-10-CM | POA: Diagnosis not present

## 2021-01-22 DIAGNOSIS — L298 Other pruritus: Secondary | ICD-10-CM | POA: Diagnosis not present

## 2021-01-22 DIAGNOSIS — L820 Inflamed seborrheic keratosis: Secondary | ICD-10-CM | POA: Diagnosis not present

## 2021-02-02 DIAGNOSIS — R1032 Left lower quadrant pain: Secondary | ICD-10-CM | POA: Diagnosis not present

## 2021-02-02 DIAGNOSIS — J9811 Atelectasis: Secondary | ICD-10-CM | POA: Diagnosis not present

## 2021-02-02 DIAGNOSIS — R109 Unspecified abdominal pain: Secondary | ICD-10-CM | POA: Diagnosis not present

## 2021-02-02 DIAGNOSIS — N390 Urinary tract infection, site not specified: Secondary | ICD-10-CM | POA: Diagnosis not present

## 2021-02-02 DIAGNOSIS — E860 Dehydration: Secondary | ICD-10-CM | POA: Diagnosis not present

## 2021-02-02 DIAGNOSIS — Z79899 Other long term (current) drug therapy: Secondary | ICD-10-CM | POA: Diagnosis not present

## 2021-02-02 DIAGNOSIS — R9431 Abnormal electrocardiogram [ECG] [EKG]: Secondary | ICD-10-CM | POA: Diagnosis not present

## 2021-02-03 DIAGNOSIS — R1032 Left lower quadrant pain: Secondary | ICD-10-CM | POA: Diagnosis not present

## 2021-02-27 DIAGNOSIS — Z87440 Personal history of urinary (tract) infections: Secondary | ICD-10-CM | POA: Diagnosis not present

## 2021-04-26 DIAGNOSIS — M79642 Pain in left hand: Secondary | ICD-10-CM | POA: Diagnosis not present

## 2021-04-26 DIAGNOSIS — M720 Palmar fascial fibromatosis [Dupuytren]: Secondary | ICD-10-CM | POA: Diagnosis not present

## 2021-04-26 DIAGNOSIS — M79641 Pain in right hand: Secondary | ICD-10-CM | POA: Diagnosis not present

## 2021-06-05 DIAGNOSIS — H353132 Nonexudative age-related macular degeneration, bilateral, intermediate dry stage: Secondary | ICD-10-CM | POA: Diagnosis not present

## 2021-06-05 DIAGNOSIS — H524 Presbyopia: Secondary | ICD-10-CM | POA: Diagnosis not present

## 2021-06-05 DIAGNOSIS — H52201 Unspecified astigmatism, right eye: Secondary | ICD-10-CM | POA: Diagnosis not present

## 2021-06-05 DIAGNOSIS — H5212 Myopia, left eye: Secondary | ICD-10-CM | POA: Diagnosis not present

## 2021-06-05 DIAGNOSIS — Z961 Presence of intraocular lens: Secondary | ICD-10-CM | POA: Diagnosis not present

## 2021-06-05 DIAGNOSIS — H5201 Hypermetropia, right eye: Secondary | ICD-10-CM | POA: Diagnosis not present

## 2021-06-05 DIAGNOSIS — H52222 Regular astigmatism, left eye: Secondary | ICD-10-CM | POA: Diagnosis not present

## 2021-06-14 DIAGNOSIS — R399 Unspecified symptoms and signs involving the genitourinary system: Secondary | ICD-10-CM | POA: Diagnosis not present

## 2021-06-14 DIAGNOSIS — K589 Irritable bowel syndrome without diarrhea: Secondary | ICD-10-CM | POA: Diagnosis not present

## 2021-06-14 DIAGNOSIS — R8290 Unspecified abnormal findings in urine: Secondary | ICD-10-CM | POA: Diagnosis not present

## 2021-07-11 DIAGNOSIS — M160 Bilateral primary osteoarthritis of hip: Secondary | ICD-10-CM | POA: Diagnosis not present

## 2021-07-11 DIAGNOSIS — E538 Deficiency of other specified B group vitamins: Secondary | ICD-10-CM | POA: Diagnosis not present

## 2021-07-11 DIAGNOSIS — Z13220 Encounter for screening for lipoid disorders: Secondary | ICD-10-CM | POA: Diagnosis not present

## 2021-07-11 DIAGNOSIS — K219 Gastro-esophageal reflux disease without esophagitis: Secondary | ICD-10-CM | POA: Diagnosis not present

## 2021-07-11 DIAGNOSIS — K589 Irritable bowel syndrome without diarrhea: Secondary | ICD-10-CM | POA: Diagnosis not present

## 2021-07-11 DIAGNOSIS — J4520 Mild intermittent asthma, uncomplicated: Secondary | ICD-10-CM | POA: Diagnosis not present

## 2021-07-11 DIAGNOSIS — Z23 Encounter for immunization: Secondary | ICD-10-CM | POA: Diagnosis not present

## 2021-07-11 DIAGNOSIS — I10 Essential (primary) hypertension: Secondary | ICD-10-CM | POA: Diagnosis not present

## 2021-08-01 IMAGING — CT CT CHEST W/O CM
2 of 3 series · 15 of 36 positions shown, 18 images · non-contrast
Comparison: 10/21/2018

CLINICAL DATA: Follow-up of pulmonary nodules on prior CT.

EXAM:
CT CHEST WITHOUT CONTRAST
TECHNIQUE: Multidetector CT imaging of the chest was performed following the
standard protocol without IV contrast.

[Series 2: thorax · axial · 0.67mm/px · z∈[-283,-29]mm · 12 of 149 slices shown, 15 images]
[im 11/149  mediastinal]
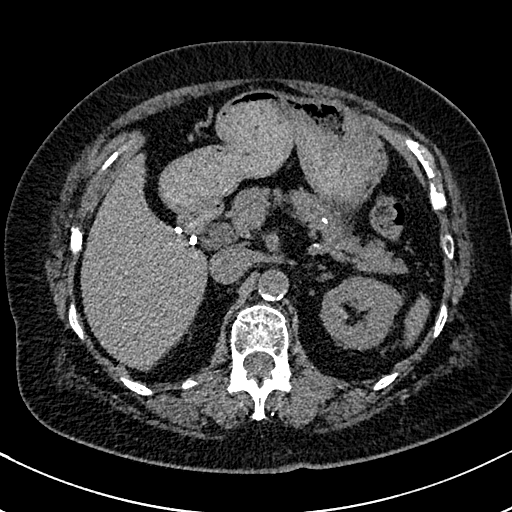
[im 11/149  lung]
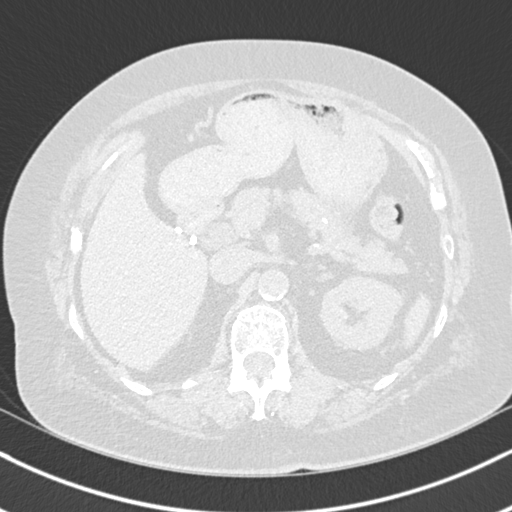
[im 22/149  lung]
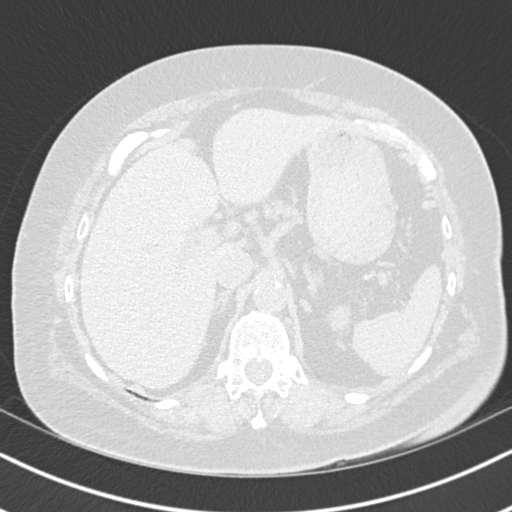
[im 33/149  lung]
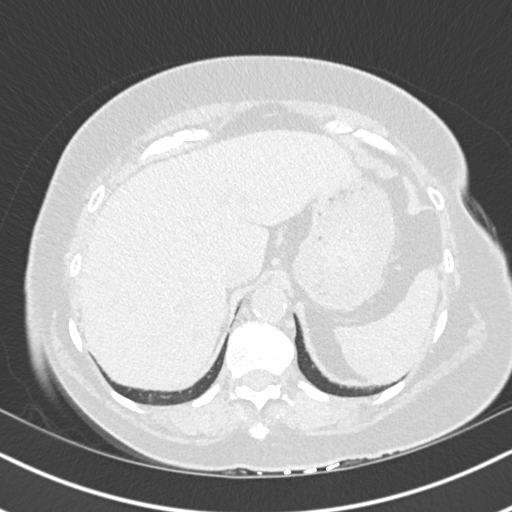
[im 44/149  lung]
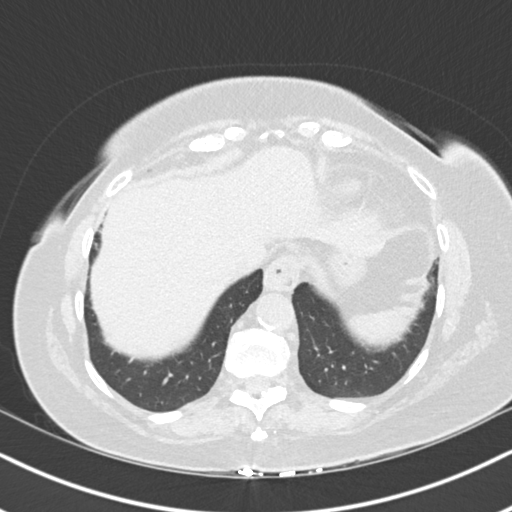
[im 55/149  mediastinal]
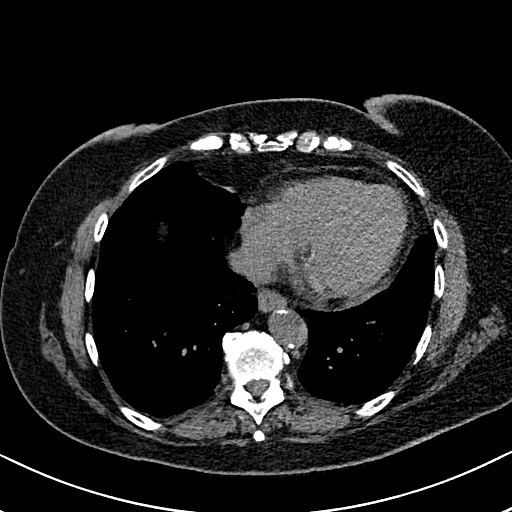
[im 55/149  lung]
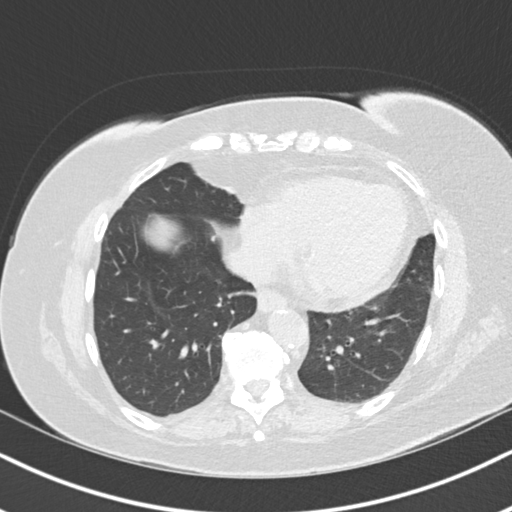
[im 66/149  lung]
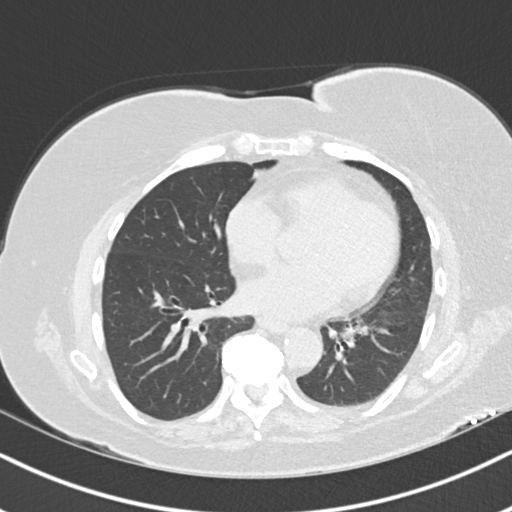
[im 83/149  lung]
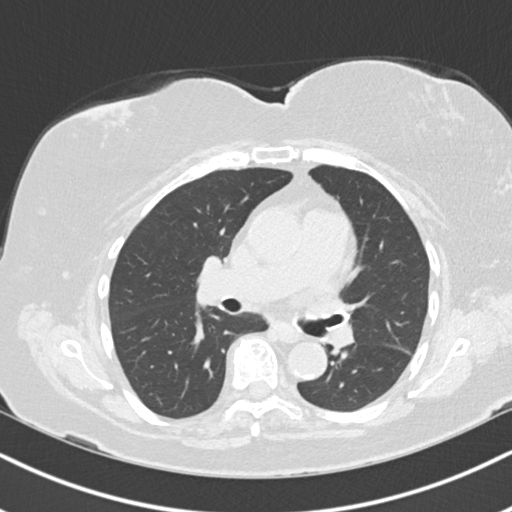
[im 94/149  lung]
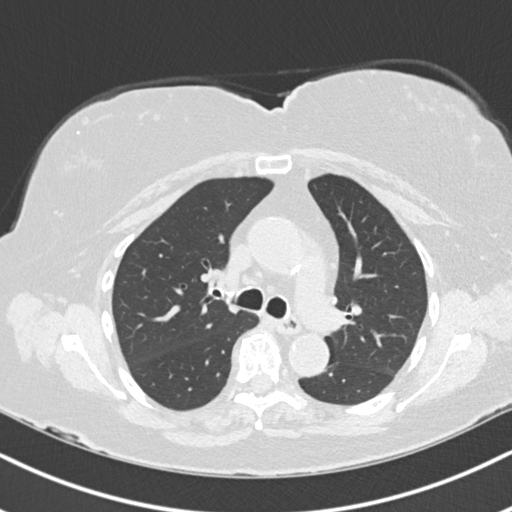
[im 105/149  mediastinal]
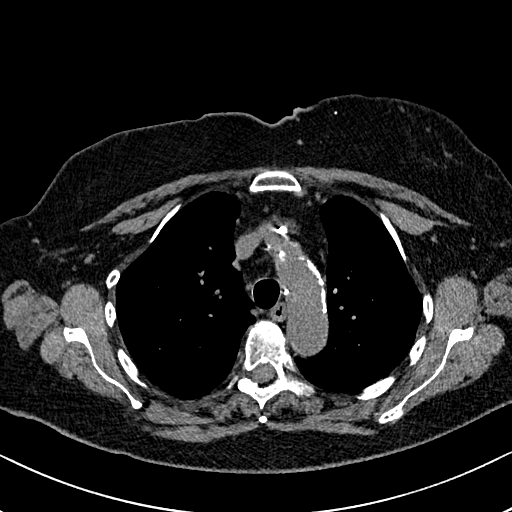
[im 105/149  lung]
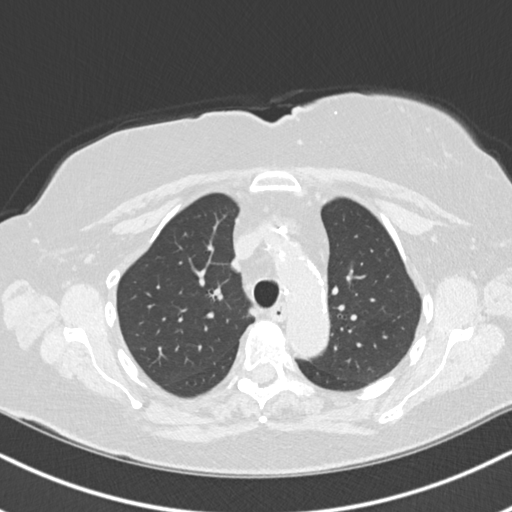
[im 116/149  lung]
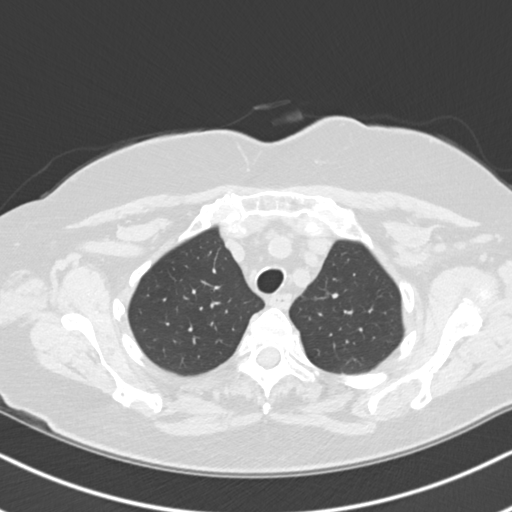
[im 127/149  lung]
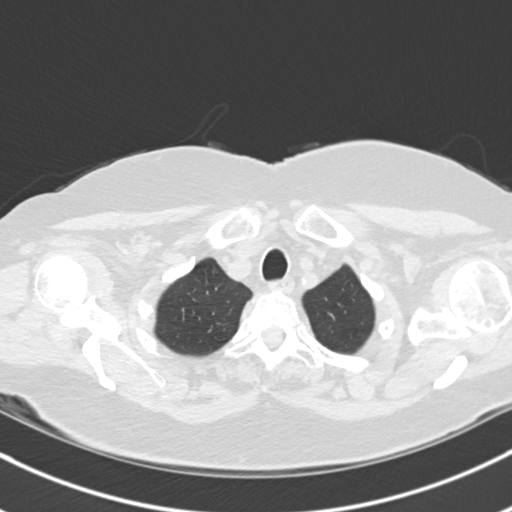
[im 138/149  lung]
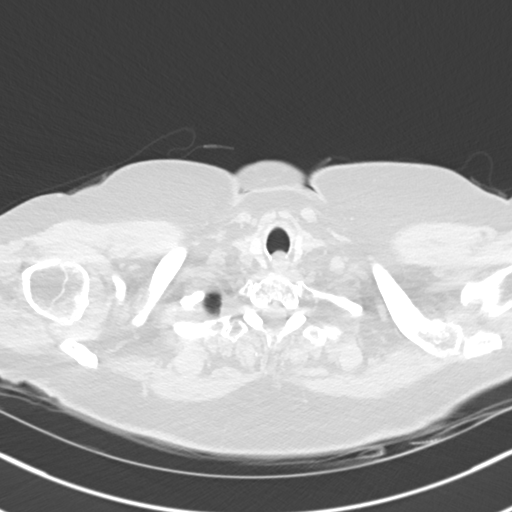

[Series 5: coronal · coronal · 0.59mm/px · 3 of 125 slices shown]
[im 25/125  lung]
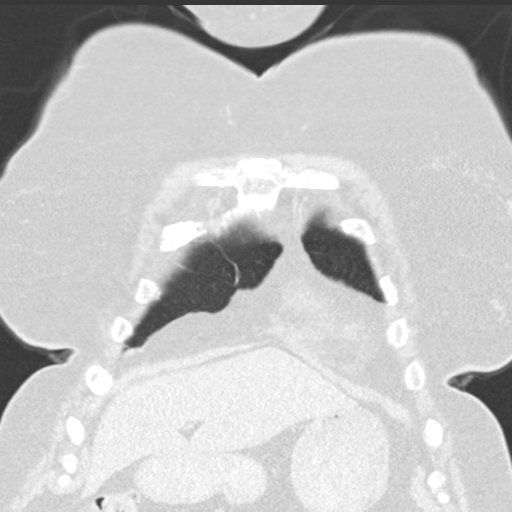
[im 50/125  lung]
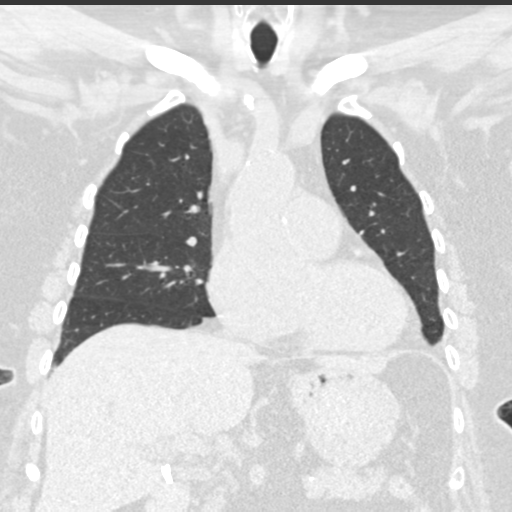
[im 75/125  lung]
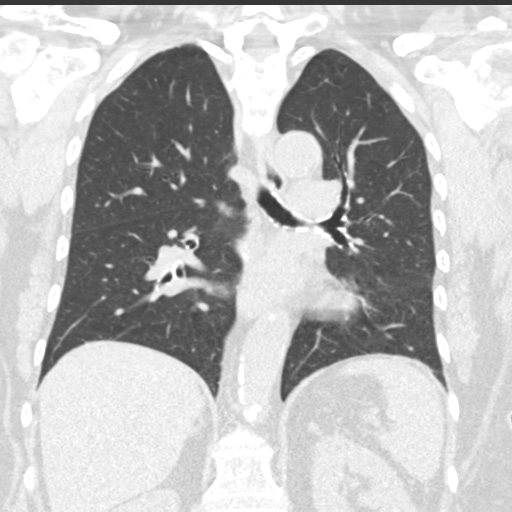

[15 of 36 positions shown; findings below may reference images not displayed]

FINDINGS: Cardiovascular: Aortic and branch vessel atherosclerosis. Borderline
cardiomegaly, without pericardial effusion. Multivessel coronary
artery atherosclerosis.

Mediastinum/Nodes: No mediastinal or definite hilar adenopathy,
given limitations of unenhanced CT. Tiny hiatal hernia.

Lungs/Pleura: No pleural fluid. Bilateral pulmonary nodules are
again identified. These are pleural predominant. The largest
measures 7 x 5 mm on 45/3. Similar to on the prior exam (when
remeasured). Other smaller nodules are also identified on series 3.

Upper Abdomen: Cholecystectomy. Normal imaged portions of the
spleen, stomach, pancreas, adrenal glands, kidneys.

Musculoskeletal: No acute osseous abnormality.
IMPRESSION: 1. Similar appearance (over 6 months) of bilateral pulmonary
nodules, favored to represent subpleural lymph nodes. These measure
maximally 7 mm. Per consensus criteria, follow-up at 12-18 months is
considered optional for low risk patients and recommended for high
risk patients. This recommendation follows the consensus statement:
Guidelines for Management of Incidental Pulmonary Nodules Detected
on CT Images:From the [HOSPITAL] 4353; published online
before print (10.1148/[id number]).
2. Coronary artery atherosclerosis. Aortic Atherosclerosis
([id number]).

## 2021-08-02 DIAGNOSIS — R1110 Vomiting, unspecified: Secondary | ICD-10-CM | POA: Diagnosis not present

## 2021-08-02 DIAGNOSIS — Z20822 Contact with and (suspected) exposure to covid-19: Secondary | ICD-10-CM | POA: Diagnosis not present

## 2021-08-02 DIAGNOSIS — R059 Cough, unspecified: Secondary | ICD-10-CM | POA: Diagnosis not present

## 2021-08-02 DIAGNOSIS — K219 Gastro-esophageal reflux disease without esophagitis: Secondary | ICD-10-CM | POA: Diagnosis not present

## 2021-08-12 DIAGNOSIS — Z1231 Encounter for screening mammogram for malignant neoplasm of breast: Secondary | ICD-10-CM | POA: Diagnosis not present

## 2021-08-12 DIAGNOSIS — J4520 Mild intermittent asthma, uncomplicated: Secondary | ICD-10-CM | POA: Diagnosis not present

## 2021-08-12 DIAGNOSIS — R059 Cough, unspecified: Secondary | ICD-10-CM | POA: Diagnosis not present

## 2021-08-12 DIAGNOSIS — R051 Acute cough: Secondary | ICD-10-CM | POA: Diagnosis not present

## 2021-08-12 DIAGNOSIS — R918 Other nonspecific abnormal finding of lung field: Secondary | ICD-10-CM | POA: Diagnosis not present

## 2021-08-21 DIAGNOSIS — J209 Acute bronchitis, unspecified: Secondary | ICD-10-CM | POA: Diagnosis not present

## 2021-08-26 DIAGNOSIS — J4521 Mild intermittent asthma with (acute) exacerbation: Secondary | ICD-10-CM | POA: Diagnosis not present

## 2021-08-26 DIAGNOSIS — R052 Subacute cough: Secondary | ICD-10-CM | POA: Diagnosis not present

## 2021-08-26 DIAGNOSIS — J302 Other seasonal allergic rhinitis: Secondary | ICD-10-CM | POA: Diagnosis not present

## 2021-08-26 DIAGNOSIS — I10 Essential (primary) hypertension: Secondary | ICD-10-CM | POA: Diagnosis not present

## 2021-08-26 DIAGNOSIS — K219 Gastro-esophageal reflux disease without esophagitis: Secondary | ICD-10-CM | POA: Diagnosis not present

## 2021-08-26 DIAGNOSIS — Z7689 Persons encountering health services in other specified circumstances: Secondary | ICD-10-CM | POA: Diagnosis not present

## 2021-09-02 DIAGNOSIS — J4521 Mild intermittent asthma with (acute) exacerbation: Secondary | ICD-10-CM | POA: Diagnosis not present

## 2021-09-02 DIAGNOSIS — J189 Pneumonia, unspecified organism: Secondary | ICD-10-CM | POA: Diagnosis not present

## 2021-09-02 DIAGNOSIS — R052 Subacute cough: Secondary | ICD-10-CM | POA: Diagnosis not present

## 2021-09-02 DIAGNOSIS — J9809 Other diseases of bronchus, not elsewhere classified: Secondary | ICD-10-CM | POA: Diagnosis not present

## 2021-09-10 DIAGNOSIS — J189 Pneumonia, unspecified organism: Secondary | ICD-10-CM | POA: Diagnosis not present

## 2021-09-10 DIAGNOSIS — J4520 Mild intermittent asthma, uncomplicated: Secondary | ICD-10-CM | POA: Diagnosis not present

## 2021-09-12 IMAGING — MG DIGITAL SCREENING BILAT W/ TOMO W/ CAD
8 series · 8 of 24 positions shown · non-contrast
Comparison: Previous exam(s).

CLINICAL DATA: Screening.

EXAM:
DIGITAL SCREENING BILATERAL MAMMOGRAM WITH TOMO AND CAD

[L CC synth-2D]
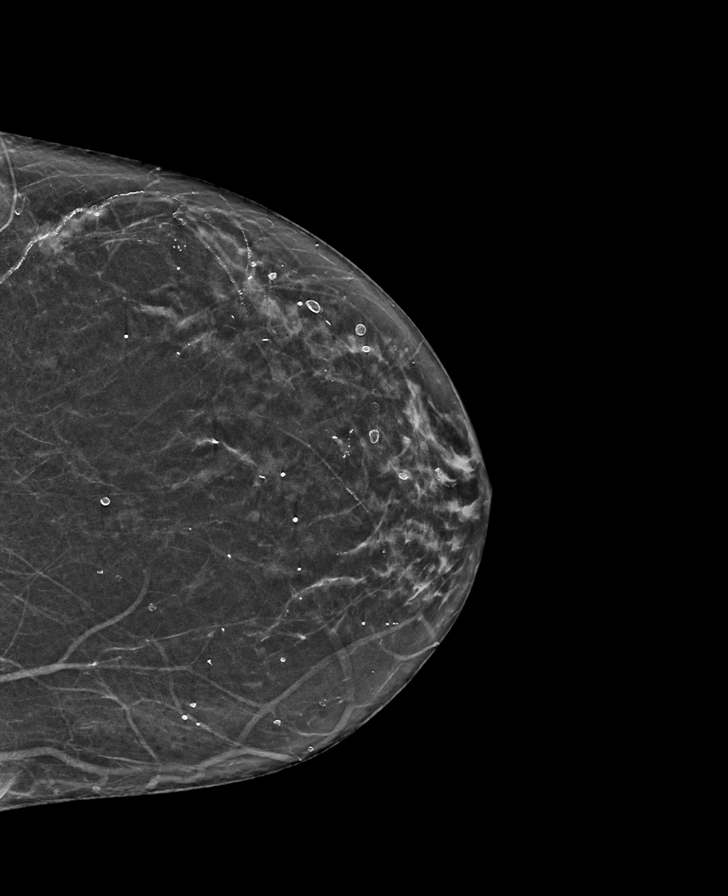

[L MLO synth-2D]
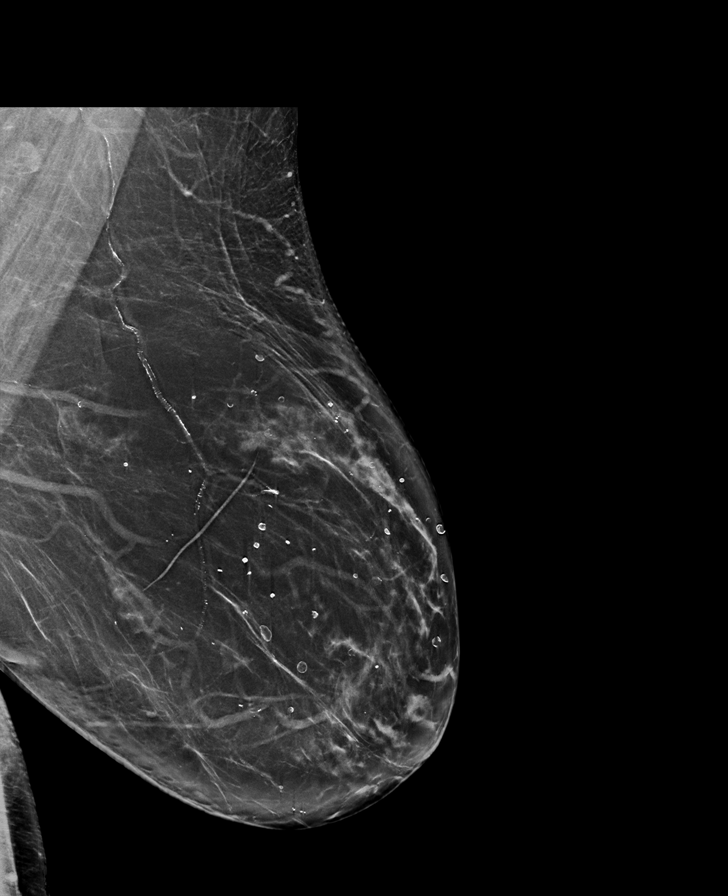

[R CC synth-2D]
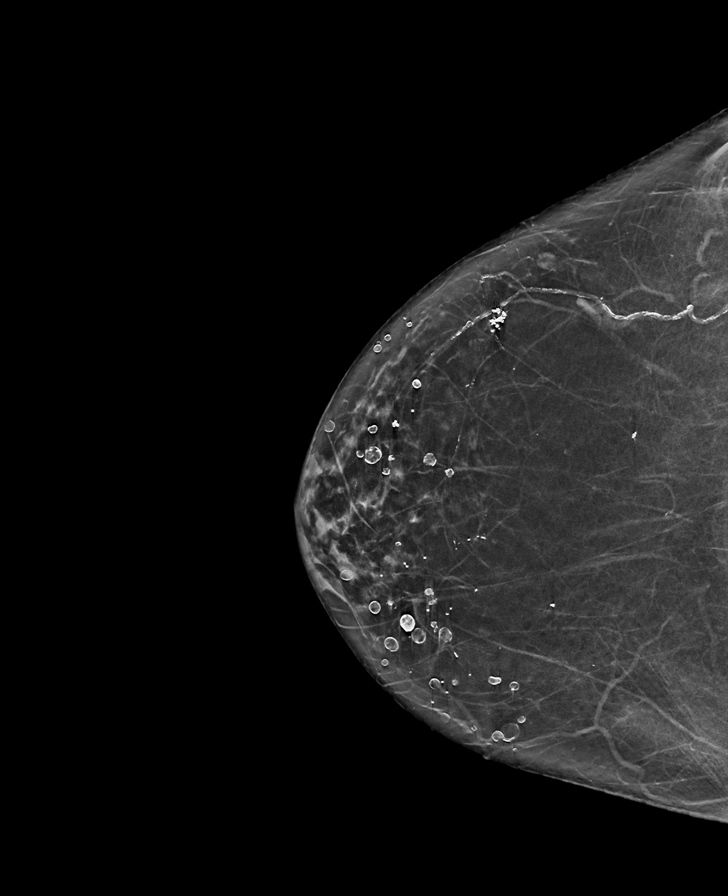

[R MLO synth-2D]
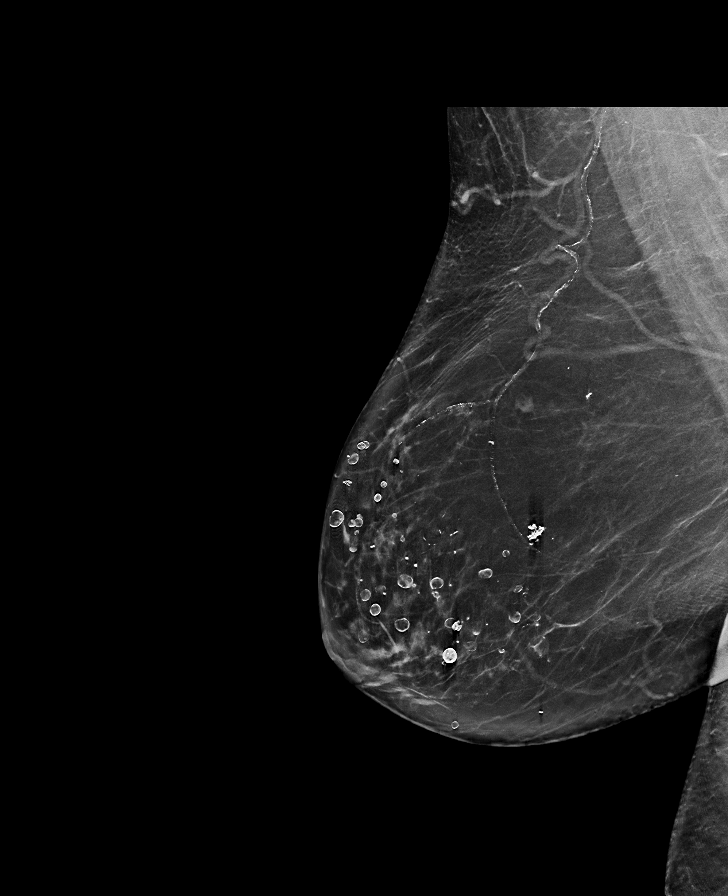

[R MLO tomo · tomo slice 39/78.0]
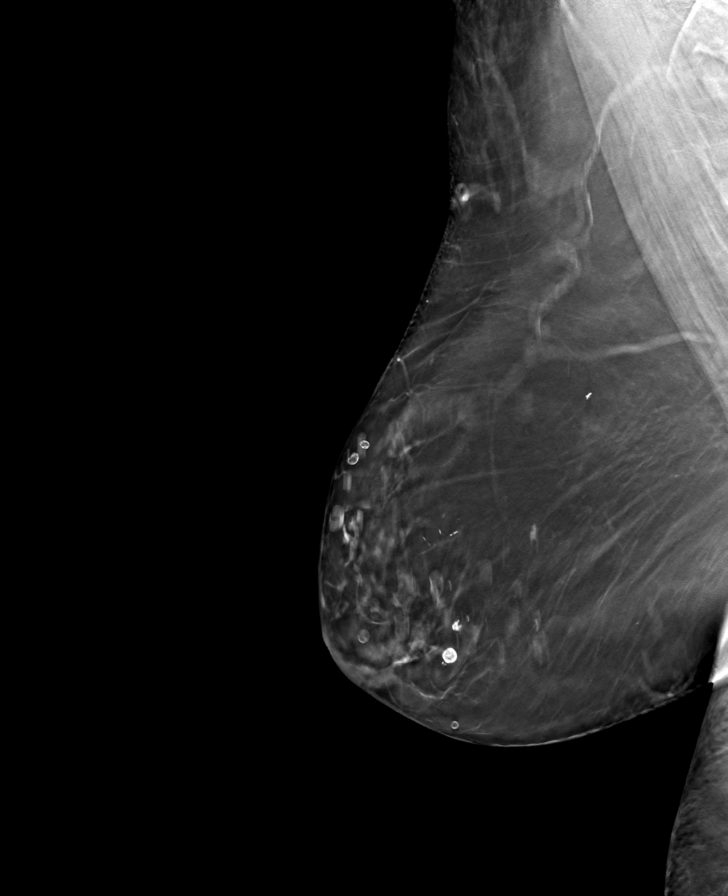

[R CC tomo · tomo slice 31/61.0]
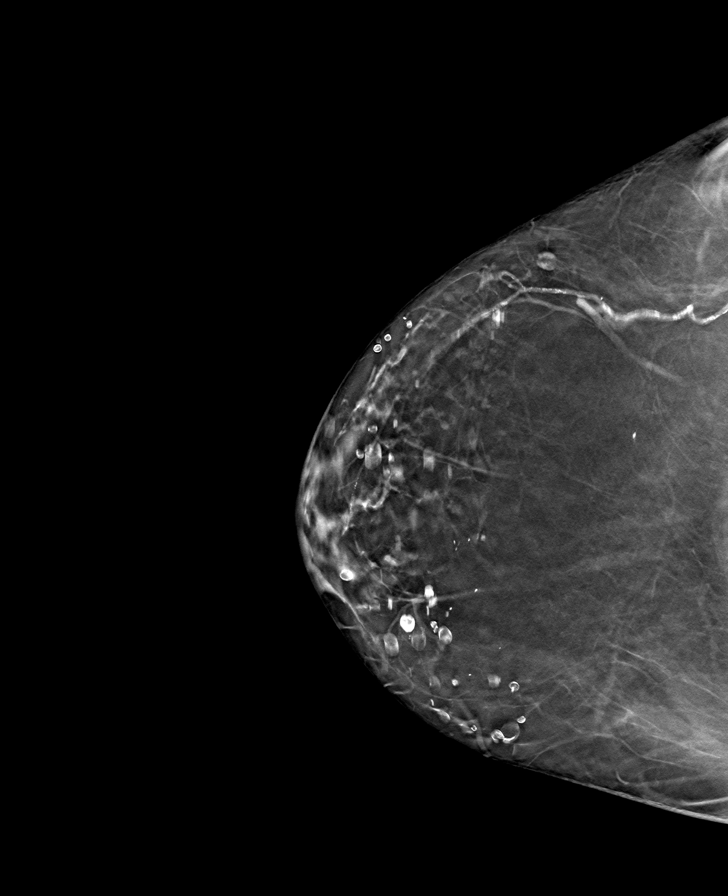

[L CC tomo · tomo slice 31/60.0]
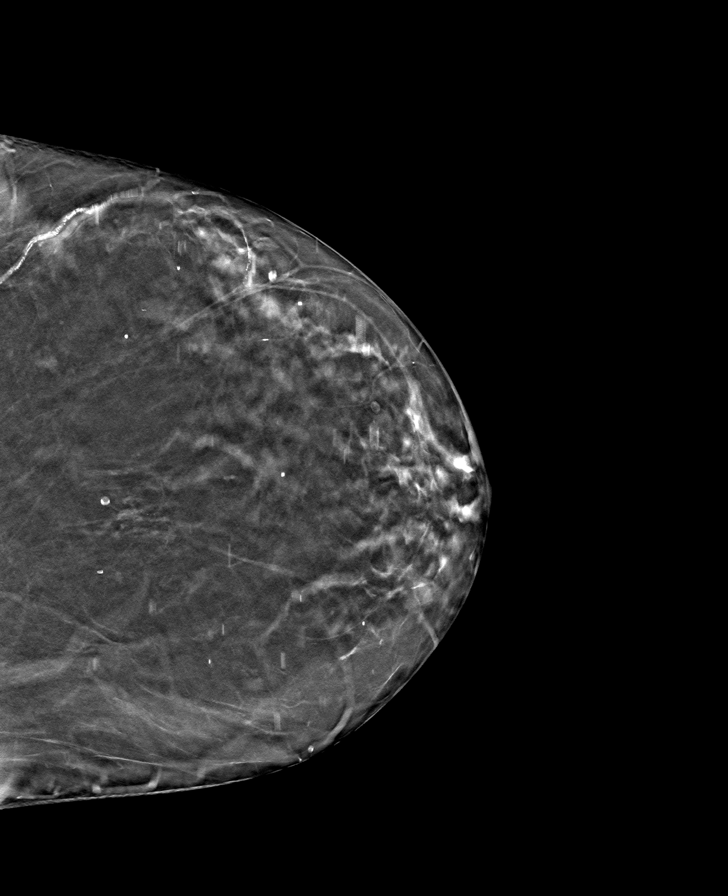

[L MLO tomo · tomo slice 42/83.0]
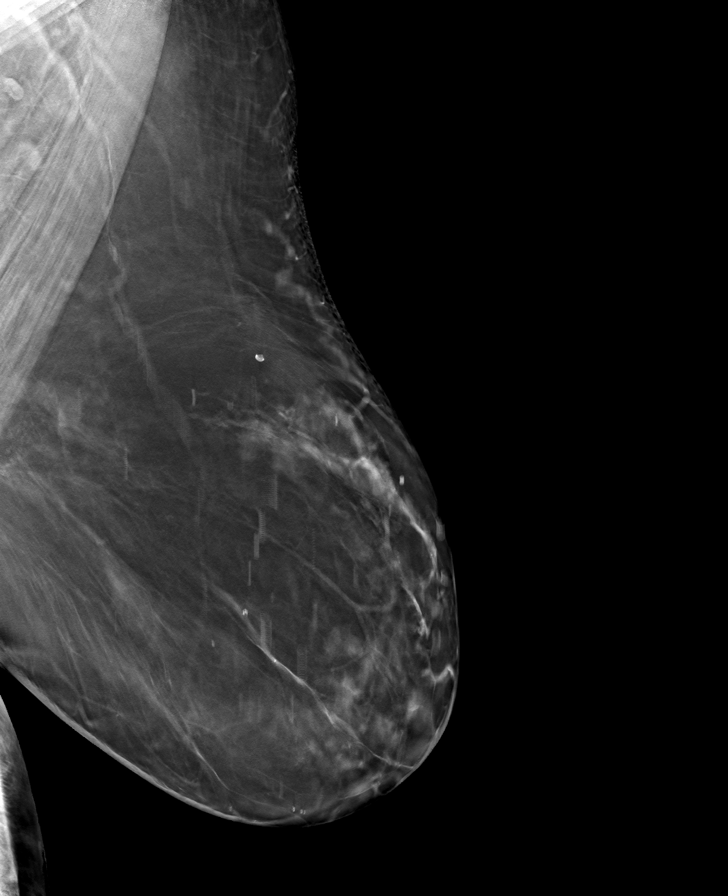

[8 of 24 positions shown; findings below may reference images not displayed]

ACR Breast Density Category b: There are scattered areas of
fibroglandular density.
FINDINGS: There are no findings suspicious for malignancy. Images were
processed with CAD.
IMPRESSION: No mammographic evidence of malignancy. A result letter of this
screening mammogram will be mailed directly to the patient.

RECOMMENDATION:
Screening mammogram in one year. (Code:[id number])

BI-RADS CATEGORY  1: Negative.
